# Patient Record
Sex: Male | Born: 1965 | Race: White | Hispanic: No | Marital: Single | Smoking: Never smoker
Health system: Southern US, Community
[De-identification: ages and names within clinical notes are randomized; demographics above are authoritative.]

## PROBLEM LIST (undated history)

## (undated) DIAGNOSIS — B019 Varicella without complication: Secondary | ICD-10-CM

## (undated) DIAGNOSIS — I10 Essential (primary) hypertension: Secondary | ICD-10-CM

## (undated) DIAGNOSIS — M109 Gout, unspecified: Secondary | ICD-10-CM

## (undated) DIAGNOSIS — E785 Hyperlipidemia, unspecified: Secondary | ICD-10-CM

## (undated) HISTORY — DX: Essential (primary) hypertension: I10

## (undated) HISTORY — DX: Hyperlipidemia, unspecified: E78.5

## (undated) HISTORY — PX: COLONOSCOPY: SHX174

## (undated) HISTORY — DX: Gout, unspecified: M10.9

## (undated) HISTORY — DX: Varicella without complication: B01.9

---

## 2011-01-01 ENCOUNTER — Ambulatory Visit (INDEPENDENT_AMBULATORY_CARE_PROVIDER_SITE_OTHER): Payer: Private Health Insurance - Indemnity | Admitting: Family Medicine

## 2011-01-01 ENCOUNTER — Encounter: Payer: Self-pay | Admitting: Family Medicine

## 2011-01-01 VITALS — BP 130/80 | HR 72 | Temp 98.6°F | Resp 12 | Ht 69.0 in | Wt 203.0 lb

## 2011-01-01 DIAGNOSIS — Z0000 Encounter for general adult medical examination without abnormal findings: Secondary | ICD-10-CM

## 2011-01-01 DIAGNOSIS — Z136 Encounter for screening for cardiovascular disorders: Secondary | ICD-10-CM

## 2011-01-01 LAB — CBC WITH DIFFERENTIAL/PLATELET
Basophils Absolute: 0 10*3/uL (ref 0.0–0.1)
Basophils Relative: 0.8 % (ref 0.0–3.0)
Eosinophils Absolute: 0.2 10*3/uL (ref 0.0–0.7)
Hemoglobin: 15.4 g/dL (ref 13.0–17.0)
Lymphocytes Relative: 44.6 % (ref 12.0–46.0)
MCHC: 35.1 g/dL (ref 30.0–36.0)
Monocytes Relative: 7.6 % (ref 3.0–12.0)
Neutro Abs: 2.1 10*3/uL (ref 1.4–7.7)
Neutrophils Relative %: 43.6 % (ref 43.0–77.0)
RBC: 4.95 Mil/uL (ref 4.22–5.81)

## 2011-01-01 LAB — BASIC METABOLIC PANEL
BUN: 20 mg/dL (ref 6–23)
CO2: 26 mEq/L (ref 19–32)
Chloride: 105 mEq/L (ref 96–112)
GFR: 62.98 mL/min (ref >60.00)
Glucose, Bld: 93 mg/dL (ref 70–99)
Potassium: 4.4 mEq/L (ref 3.5–5.1)
Sodium: 140 mEq/L (ref 135–145)

## 2011-01-01 LAB — LIPID PANEL
HDL: 50.3 mg/dL (ref >39.00)
Total CHOL/HDL Ratio: 4
VLDL: 21.8 mg/dL (ref 0.0–40.0)

## 2011-01-01 LAB — HEPATIC FUNCTION PANEL
ALT: 27 U/L (ref 0–53)
Alkaline Phosphatase: 61 U/L (ref 39–117)
Bilirubin, Direct: 0.1 mg/dL (ref 0.0–0.3)
Total Bilirubin: 0.8 mg/dL (ref 0.3–1.2)

## 2011-01-01 MED ORDER — TETANUS-DIPHTH-ACELL PERTUSSIS 5-2.5-18.5 LF-MCG/0.5 IM SUSP: 0.5000 mL | Freq: Once | INTRAMUSCULAR | Status: DC

## 2011-01-01 NOTE — Progress Notes (Signed)
  Subjective:    Patient ID: James Monroe, male    DOB: Oct 21, 1965, 45 y.o.   MRN: [id number]  HPI Patient is seen to establish care. He is here for complete physical examination. Past medical history reviewed. No chronic medical problems. No prior surgeries. Takes no medications. No known drug allergies. Patient exercises regularly. Last tetanus unknown but felt to be over 10 years ago. Nonsmoker. No regular alcohol use.  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 Family history is significant for father having heart disease with bypass age 81 along with hyperlipidemia and hypertension.   Review of Systems  Constitutional: Negative for fever, activity change, appetite change and fatigue.  HENT: Negative for ear pain, congestion and trouble swallowing.   Eyes: Negative for pain and visual disturbance.  Respiratory: Negative for cough, shortness of breath and wheezing.   Cardiovascular: Negative for chest pain and palpitations.  Gastrointestinal: Negative for nausea, vomiting, abdominal pain, diarrhea, constipation, blood in stool, abdominal distention and rectal pain.  Genitourinary: Negative for dysuria, hematuria and testicular pain.  Musculoskeletal: Negative for joint swelling and arthralgias.  Skin: Negative for rash.  Neurological: Negative for dizziness, syncope and headaches.  Hematological: Negative for adenopathy.  Psychiatric/Behavioral: Negative for confusion and dysphoric mood.       Objective:   Physical Exam  Constitutional: He is oriented to person, place, and time. He appears well-developed and well-nourished. No distress.  HENT:  Head: Normocephalic and atraumatic.  Right Ear: External ear normal.  Left Ear: External ear normal.  Mouth/Throat: Oropharynx is clear and moist.  Eyes: Conjunctivae and EOM are normal. Pupils are equal, round, and reactive to light.  Neck: Normal range of motion. Neck supple. No thyromegaly present.  Cardiovascular: Normal rate,  regular rhythm and normal heart sounds.   No murmur heard. Pulmonary/Chest: No respiratory distress. He has no wheezes. He has no rales.  Abdominal: Soft. Bowel sounds are normal. He exhibits no distension and no mass. There is no tenderness. There is no rebound and no guarding.  Genitourinary: Rectum normal and prostate normal.  Musculoskeletal: He exhibits no edema.  Lymphadenopathy:    He has no cervical adenopathy.  Neurological: He is alert and oriented to person, place, and time. He displays normal reflexes. No cranial nerve deficit.  Skin: No rash noted.  Psychiatric: He has a normal mood and affect.          Assessment & Plan:  Complete Physical Exam.  Tdap given.  Check screening labs.  Discussed continuation of regular exercise.

## 2011-01-01 NOTE — Patient Instructions (Signed)
We will call you with lab results. Continue regular exercise habits.

## 2011-01-02 NOTE — Progress Notes (Signed)
Quick Note:  Pt informed ______ 

## 2011-01-23 ENCOUNTER — Telehealth: Payer: Self-pay | Admitting: *Deleted

## 2011-01-23 NOTE — Telephone Encounter (Signed)
Would like to speak to Dr. Caryl Never re:  A new diagnosis he received from his dentist of Sleep Apnea.

## 2011-01-24 NOTE — Telephone Encounter (Signed)
Spoke with pt.  He is being evaluated for some sort of dental appliance for sleep apnea.  I explained I'm not familiar with that particular device but will try to do some research.

## 2013-07-27 ENCOUNTER — Ambulatory Visit: Payer: Self-pay | Admitting: Family Medicine

## 2013-07-28 ENCOUNTER — Ambulatory Visit (INDEPENDENT_AMBULATORY_CARE_PROVIDER_SITE_OTHER): Payer: Private Health Insurance - Indemnity | Admitting: Family Medicine

## 2013-07-28 ENCOUNTER — Encounter: Payer: Self-pay | Admitting: Family Medicine

## 2013-07-28 VITALS — BP 130/70 | HR 60 | Temp 97.8°F | Wt 203.0 lb

## 2013-07-28 DIAGNOSIS — J019 Acute sinusitis, unspecified: Secondary | ICD-10-CM

## 2013-07-28 MED ORDER — AMOXICILLIN-POT CLAVULANATE 875-125 MG PO TABS: 1.0000 | ORAL_TABLET | Freq: Two times a day (BID) | ORAL | Status: DC

## 2013-07-28 NOTE — Progress Notes (Signed)
   Subjective:    Patient ID: James Monroe, male    DOB: 09-14-1965, 47 y.o.   MRN: [id number]  HPI Acute visit Patient seen with 3 week history of productive cough. He has no chronic medical problems. Nonsmoker. He's had some bifrontal sinus pressure. Cough productive of yellow sputum. Occasional yellow-tan nasal mucus. Intermittent headaches. Rare sore throat. Denies any nausea or vomiting. No relief with over-the-counter medications. Recently has had some flying and had some ear pain with that.  Past Medical History  Diagnosis Date  . Chicken pox   . Concussion 2001   No past surgical history on file.  reports that he has never smoked. He does not have any smokeless tobacco history on file. His alcohol and drug histories are not on file. family history includes Heart disease (age of onset: 78) in his father; Hyperlipidemia in his father; Hypertension in his father. No Known Allergies    Review of Systems  Constitutional: Negative for fever and chills.  HENT: Positive for congestion.   Respiratory: Positive for cough. Negative for shortness of breath and wheezing.        Objective:   Physical Exam  Constitutional: He appears well-developed and well-nourished.  HENT:  Right Ear: External ear normal.  Left Ear: External ear normal.  Mouth/Throat: Oropharynx is clear and moist.  Neck: Neck supple.  Cardiovascular: Normal rate.   Pulmonary/Chest: Effort normal and breath sounds normal. No respiratory distress. He has no wheezes. He has no rales.  Lymphadenopathy:    He has no cervical adenopathy.          Assessment & Plan:  Probable acute sinusitis. Given duration of symptoms, start Augmentin 875 mg twice daily. Try over-the-counter Mucinex. Hydrate well. Followup as needed.

## 2013-07-28 NOTE — Progress Notes (Signed)
Pre visit review using our clinic review tool, if applicable. No additional management support is needed unless otherwise documented below in the visit note. 

## 2013-07-28 NOTE — Patient Instructions (Signed)

## 2015-03-29 ENCOUNTER — Other Ambulatory Visit (INDEPENDENT_AMBULATORY_CARE_PROVIDER_SITE_OTHER): Payer: No Typology Code available for payment source

## 2015-03-29 DIAGNOSIS — Z0000 Encounter for general adult medical examination without abnormal findings: Secondary | ICD-10-CM

## 2015-03-29 LAB — BASIC METABOLIC PANEL
BUN: 20 mg/dL (ref 6–23)
CALCIUM: 9.5 mg/dL (ref 8.4–10.5)
CO2: 28 meq/L (ref 19–32)
CREATININE: 1.11 mg/dL (ref 0.40–1.50)
Chloride: 106 mEq/L (ref 96–112)
GFR: 74.26 mL/min (ref >60.00)
Glucose, Bld: 105 mg/dL — ABNORMAL HIGH (ref 70–99)
POTASSIUM: 4.4 meq/L (ref 3.5–5.1)
Sodium: 141 mEq/L (ref 135–145)

## 2015-03-29 LAB — CBC WITH DIFFERENTIAL/PLATELET
BASOS ABS: 0 10*3/uL (ref 0.0–0.1)
Basophils Relative: 0.6 % (ref 0.0–3.0)
EOS ABS: 0.1 10*3/uL (ref 0.0–0.7)
Eosinophils Relative: 2.9 % (ref 0.0–5.0)
HEMATOCRIT: 46.1 % (ref 39.0–52.0)
Hemoglobin: 15.8 g/dL (ref 13.0–17.0)
LYMPHS PCT: 40.2 % (ref 12.0–46.0)
Lymphs Abs: 2.1 10*3/uL (ref 0.7–4.0)
MCHC: 34.3 g/dL (ref 30.0–36.0)
MCV: 89.2 fl (ref 78.0–100.0)
MONO ABS: 0.3 10*3/uL (ref 0.1–1.0)
MONOS PCT: 6.1 % (ref 3.0–12.0)
NEUTROS ABS: 2.6 10*3/uL (ref 1.4–7.7)
Neutrophils Relative %: 50.2 % (ref 43.0–77.0)
PLATELETS: 197 10*3/uL (ref 150.0–400.0)
RBC: 5.17 Mil/uL (ref 4.22–5.81)
RDW: 12.8 % (ref 11.5–15.5)
WBC: 5.1 10*3/uL (ref 4.0–10.5)

## 2015-03-29 LAB — POCT URINALYSIS DIPSTICK
Bilirubin, UA: NEGATIVE
GLUCOSE UA: NEGATIVE
KETONES UA: NEGATIVE
Leukocytes, UA: NEGATIVE
Nitrite, UA: NEGATIVE
PH UA: 5.5
Protein, UA: NEGATIVE
UROBILINOGEN UA: 0.2

## 2015-03-29 LAB — PSA: PSA: 0.83 ng/mL (ref 0.10–4.00)

## 2015-03-29 LAB — HEPATIC FUNCTION PANEL
ALBUMIN: 4.5 g/dL (ref 3.5–5.2)
ALK PHOS: 71 U/L (ref 39–117)
ALT: 21 U/L (ref 0–53)
AST: 17 U/L (ref 0–37)
BILIRUBIN TOTAL: 0.5 mg/dL (ref 0.2–1.2)
Bilirubin, Direct: 0.1 mg/dL (ref 0.0–0.3)
Total Protein: 6.8 g/dL (ref 6.0–8.3)

## 2015-03-29 LAB — LIPID PANEL
Cholesterol: 222 mg/dL — ABNORMAL HIGH (ref 0–200)
HDL: 64.4 mg/dL (ref >39.00)
LDL CALC: 135 mg/dL — AB (ref 0–99)
NONHDL: 157.56
TRIGLYCERIDES: 113 mg/dL (ref 0.0–149.0)
Total CHOL/HDL Ratio: 3
VLDL: 22.6 mg/dL (ref 0.0–40.0)

## 2015-03-29 LAB — TSH: TSH: 2.66 u[IU]/mL (ref 0.35–4.50)

## 2015-04-05 ENCOUNTER — Encounter: Payer: Self-pay | Admitting: Family Medicine

## 2015-04-13 ENCOUNTER — Ambulatory Visit (INDEPENDENT_AMBULATORY_CARE_PROVIDER_SITE_OTHER): Payer: No Typology Code available for payment source | Admitting: Family Medicine

## 2015-04-13 ENCOUNTER — Encounter: Payer: Self-pay | Admitting: Family Medicine

## 2015-04-13 VITALS — BP 128/78 | HR 60 | Temp 97.9°F | Ht 69.0 in | Wt 203.2 lb

## 2015-04-13 DIAGNOSIS — R312 Other microscopic hematuria: Secondary | ICD-10-CM | POA: Diagnosis not present

## 2015-04-13 DIAGNOSIS — B351 Tinea unguium: Secondary | ICD-10-CM

## 2015-04-13 DIAGNOSIS — Z0000 Encounter for general adult medical examination without abnormal findings: Secondary | ICD-10-CM | POA: Diagnosis not present

## 2015-04-13 DIAGNOSIS — Z113 Encounter for screening for infections with a predominantly sexual mode of transmission: Secondary | ICD-10-CM

## 2015-04-13 DIAGNOSIS — R3129 Other microscopic hematuria: Secondary | ICD-10-CM

## 2015-04-13 LAB — POCT URINALYSIS DIPSTICK
BILIRUBIN UA: NEGATIVE
Blood, UA: NEGATIVE
Glucose, UA: NEGATIVE
KETONES UA: NEGATIVE
Leukocytes, UA: NEGATIVE
Nitrite, UA: NEGATIVE
PROTEIN UA: NEGATIVE
SPEC GRAV UA: 1.025
Urobilinogen, UA: 0.2
pH, UA: 6

## 2015-04-13 MED ORDER — TERBINAFINE HCL 250 MG PO TABS: 250.0000 mg | ORAL_TABLET | Freq: Every day | ORAL | Status: DC

## 2015-04-13 NOTE — Progress Notes (Signed)
Subjective:    Patient ID: James Monroe, male    DOB: 17-Sep-1965, 49 y.o.   MRN: [id number]  HPI Patient seen for complete physical. Generally very healthy. Takes no regular medications. No chronic medical problems. Last tetanus 2012. He turned 48 this year. He has not had screening colonoscopy yet but is willing to go. He wishes to call back with his preference for provider.  He does have a separate issue of dystrophic fingernail right middle finger. No associated pain. He's had onychomycosis involving toenails in the past and thinks he took Lamisil for that without event. No history of liver problems. Nonsmoker. Exercises regularly. Family history reviewed. His father had coronary disease age 45  Patient also requesting STD screening. He's had multiple sexual partners. Does use barrier protection. No history of STD. No recent concerning rashes, dysuria, or penile discharge.  Past Medical History  Diagnosis Date  . Chicken pox   . Concussion 2001   No past surgical history on file.  reports that he has never smoked. He does not have any smokeless tobacco history on file. His alcohol and drug histories are not on file. family history includes Heart disease (age of onset: 55) in his father; Hypertension in his father. No Known Allergies    Review of Systems  Constitutional: Negative for fever, activity change, appetite change and fatigue.  HENT: Negative for congestion, ear pain and trouble swallowing.   Eyes: Negative for pain and visual disturbance.  Respiratory: Negative for cough, shortness of breath and wheezing.   Cardiovascular: Negative for chest pain and palpitations.  Gastrointestinal: Negative for nausea, vomiting, abdominal pain, diarrhea, constipation, blood in stool, abdominal distention and rectal pain.  Genitourinary: Negative for dysuria, hematuria and testicular pain.  Musculoskeletal: Negative for joint swelling and arthralgias.  Skin: Negative for rash.    Neurological: Negative for dizziness, syncope and headaches.  Hematological: Negative for adenopathy.  Psychiatric/Behavioral: Negative for confusion and dysphoric mood.       Objective:   Physical Exam  Constitutional: He is oriented to person, place, and time. He appears well-developed and well-nourished. No distress.  HENT:  Head: Normocephalic and atraumatic.  Right Ear: External ear normal.  Left Ear: External ear normal.  Mouth/Throat: Oropharynx is clear and moist.  Eyes: Conjunctivae and EOM are normal. Pupils are equal, round, and reactive to light.  Neck: Normal range of motion. Neck supple. No thyromegaly present.  Cardiovascular: Normal rate, regular rhythm and normal heart sounds.   No murmur heard. Pulmonary/Chest: No respiratory distress. He has no wheezes. He has no rales.  Abdominal: Soft. Bowel sounds are normal. He exhibits no distension and no mass. There is no tenderness. There is no rebound and no guarding.  Musculoskeletal: He exhibits no edema.  Lymphadenopathy:    He has no cervical adenopathy.  Neurological: He is alert and oriented to person, place, and time. He displays normal reflexes. No cranial nerve deficit.  Skin: No rash noted.  Right middle finger mild dystrophic changes along one lateral border. He has some brittle changes involving that border No paronychia  Psychiatric: He has a normal mood and affect.          Assessment & Plan:  #1 complete physical. Labs reviewed. He does have mildly elevated glucose 105 which was surprising to him. Continue regular exercise habits. Watch sugar and starch intake. Consider follow-up fasting glucose in one year and consider A1c then.  His urine did reveal trace blood on dipstick and repeat today is  negative #2 mild onychomycosis changes right middle finger. Lamisil 250 mg daily for 3 months. Reviewed potential side effects #3 STD screening-per patient request. Check for HIV, RPR, urine for GC and Chlamydia,  hepatitis B screening (he is not sure if these had previous vaccination series)

## 2015-04-13 NOTE — Patient Instructions (Signed)
Recommend screening colonoscopy this year. Let me know if you need a referral-and your preference for provider Your last tetanus was 2012 and you will not need a booster until 2022 Your blood sugar was mildly elevated at 105 which is prediabetes range. Continue regular exercise habits. Watch sugar and white starch intake

## 2015-04-13 NOTE — Progress Notes (Signed)
Pre visit review using our clinic review tool, if applicable. No additional management support is needed unless otherwise documented below in the visit note. 

## 2015-04-14 LAB — HEPATITIS B SURFACE ANTIGEN: HEP B S AG: NEGATIVE

## 2015-04-14 LAB — HIV ANTIBODY (ROUTINE TESTING W REFLEX): HIV 1&2 Ab, 4th Generation: NONREACTIVE

## 2015-04-14 LAB — RPR

## 2015-04-14 LAB — GC/CHLAMYDIA PROBE AMP, URINE
CHLAMYDIA, SWAB/URINE, PCR: NEGATIVE
GC Probe Amp, Urine: NEGATIVE

## 2015-04-14 LAB — HEPATITIS B SURFACE ANTIBODY,QUALITATIVE: Hep B S Ab: NEGATIVE

## 2015-08-17 ENCOUNTER — Encounter: Payer: Self-pay | Admitting: Family Medicine

## 2015-08-17 ENCOUNTER — Ambulatory Visit (INDEPENDENT_AMBULATORY_CARE_PROVIDER_SITE_OTHER): Payer: Managed Care, Other (non HMO) | Admitting: Family Medicine

## 2015-08-17 VITALS — BP 150/90 | HR 87 | Temp 98.3°F | Resp 16 | Ht 69.0 in | Wt 213.2 lb

## 2015-08-17 DIAGNOSIS — L209 Atopic dermatitis, unspecified: Secondary | ICD-10-CM | POA: Diagnosis not present

## 2015-08-17 MED ORDER — TRIAMCINOLONE ACETONIDE 0.1 % EX CREA: 1.0000 "application " | TOPICAL_CREAM | Freq: Two times a day (BID) | CUTANEOUS | Status: DC | PRN

## 2015-08-17 NOTE — Progress Notes (Signed)
   Subjective:    Patient ID: James Monroe, male    DOB: February 01, 1966, 49 y.o.   MRN: [id number]  HPI Patient seen for acute problem which is bilateral arm rash Onset about 11 days ago. Only thing different is that he was house sitting for a cat. He did not recall any nasal congestion. No other areas of rash. Rashes pruritic. He tried moisturizers without much improvement. No history of atopic dermatitis. Denies any recent change of soaps or detergents.  Past Medical History  Diagnosis Date  . Chicken pox   . Concussion 2001   No past surgical history on file.  reports that he has never smoked. He does not have any smokeless tobacco history on file. His alcohol and drug histories are not on file. family history includes Heart disease (age of onset: 47) in his father; Hypertension in his father. No Known Allergies    Review of Systems  Constitutional: Negative for fever and chills.  HENT: Negative for congestion.   Respiratory: Negative for cough.   Skin: Positive for rash.       Objective:   Physical Exam  Constitutional: He appears well-developed and well-nourished.  Cardiovascular: Normal rate and regular rhythm.   Pulmonary/Chest: Effort normal and breath sounds normal. No respiratory distress. He has no wheezes. He has no rales.  Skin: Rash noted.  Patient has somewhat dry excoriated erythematous rash anterior aspect of elbow bilaterally. Nontender          Assessment & Plan:  Bilateral arm rash. Differential is contact dermatitis versus atopic dermatitis. Suspect the latter. Triamcinolone 0.1% cream twice daily as needed.

## 2015-08-17 NOTE — Progress Notes (Signed)
Pre visit review using our clinic review tool, if applicable. No additional management support is needed unless otherwise documented below in the visit note. 

## 2015-10-20 ENCOUNTER — Encounter: Payer: Self-pay | Admitting: Family Medicine

## 2015-10-20 ENCOUNTER — Ambulatory Visit (INDEPENDENT_AMBULATORY_CARE_PROVIDER_SITE_OTHER): Payer: Managed Care, Other (non HMO) | Admitting: Family Medicine

## 2015-10-20 VITALS — BP 110/80 | HR 68 | Temp 98.2°F | Ht 69.0 in | Wt 214.0 lb

## 2015-10-20 DIAGNOSIS — L259 Unspecified contact dermatitis, unspecified cause: Secondary | ICD-10-CM

## 2015-10-20 MED ORDER — PREDNISONE 10 MG PO TABS: ORAL_TABLET | ORAL | Status: DC

## 2015-10-20 NOTE — Progress Notes (Signed)
   Subjective:    Patient ID: James Monroe, male    DOB: 10/12/1965, 50 y.o.   MRN: [id number]  HPI Pruritic rash of both forearms. Patient had similar reaction back in December of unknown cause. Eventually improved with topical steroid. Current rash itches and is slightly vesicular and very symmetric. Only confined to the dorsal aspect of the forearms He is not aware of any change of soaps or detergents. No plant or chemical exposure. No pets. Rash is exacerbated by heat. No alleviating factors  Past Medical History  Diagnosis Date  . Chicken pox   . Concussion 2001   No past surgical history on file.  reports that he has never smoked. He does not have any smokeless tobacco history on file. His alcohol and drug histories are not on file. family history includes Heart disease (age of onset: 4) in his father; Hypertension in his father. No Known Allergies]\    Review of Systems  Constitutional: Negative for fever and chills.  Skin: Positive for rash.       Objective:   Physical Exam  Constitutional: He appears well-developed and well-nourished.  Cardiovascular: Normal rate and regular rhythm.   Skin: Rash noted.  She has diffuse rash all dorsal aspect of both forearms proximal one half. This is slightly raised and slightly vesicular and nontender.          Assessment & Plan:  Contact dermatitis bilateral forearms. No clear etiology. Prednisone taper over 12 days. Continue to search for possible triggers.

## 2015-10-20 NOTE — Progress Notes (Signed)
Pre visit review using our clinic review tool, if applicable. No additional management support is needed unless otherwise documented below in the visit note. 

## 2015-10-20 NOTE — Patient Instructions (Signed)
Contact Dermatitis Dermatitis is redness, soreness, and swelling (inflammation) of the skin. Contact dermatitis is a reaction to certain substances that touch the skin. There are two types of contact dermatitis:   Irritant contact dermatitis. This type is caused by something that irritates your skin, such as dry hands from washing them too much. This type does not require previous exposure to the substance for a reaction to occur. This type is more common.  Allergic contact dermatitis. This type is caused by a substance that you are allergic to, such as a nickel allergy or poison ivy. This type only occurs if you have been exposed to the substance (allergen) before. Upon a repeat exposure, your body reacts to the substance. This type is less common. CAUSES  Many different substances can cause contact dermatitis. Irritant contact dermatitis is most commonly caused by exposure to:   Makeup.   Soaps.   Detergents.   Bleaches.   Acids.   Metal salts, such as nickel.  Allergic contact dermatitis is most commonly caused by exposure to:   Poisonous plants.   Chemicals.   Jewelry.   Latex.   Medicines.   Preservatives in products, such as clothing.  RISK FACTORS This condition is more likely to develop in:   People who have jobs that expose them to irritants or allergens.  People who have certain medical conditions, such as asthma or eczema.  SYMPTOMS  Symptoms of this condition may occur anywhere on your body where the irritant has touched you or is touched by you. Symptoms include:  Dryness or flaking.   Redness.   Cracks.   Itching.   Pain or a burning feeling.   Blisters.  Drainage of small amounts of blood or clear fluid from skin cracks. With allergic contact dermatitis, there may also be swelling in areas such as the eyelids, mouth, or genitals.  DIAGNOSIS  This condition is diagnosed with a medical history and physical exam. A patch skin test  may be performed to help determine the cause. If the condition is related to your job, you may need to see an occupational medicine specialist. TREATMENT Treatment for this condition includes figuring out what caused the reaction and protecting your skin from further contact. Treatment may also include:   Steroid creams or ointments. Oral steroid medicines may be needed in more severe cases.  Antibiotics or antibacterial ointments, if a skin infection is present.  Antihistamine lotion or an antihistamine taken by mouth to ease itching.  A bandage (dressing). HOME CARE INSTRUCTIONS Skin Care  Moisturize your skin as needed.   Apply cool compresses to the affected areas.  Try taking a bath with:  Epsom salts. Follow the instructions on the packaging. You can get these at your local pharmacy or grocery store.  Baking soda. Pour a small amount into the bath as directed by your health care provider.  Colloidal oatmeal. Follow the instructions on the packaging. You can get this at your local pharmacy or grocery store.  Try applying baking soda paste to your skin. Stir water into baking soda until it reaches a paste-like consistency.  Do not scratch your skin.  Bathe less frequently, such as every other day.  Bathe in lukewarm water. Avoid using hot water. Medicines  Take or apply over-the-counter and prescription medicines only as told by your health care provider.   If you were prescribed an antibiotic medicine, take or apply your antibiotic as told by your health care provider. Do not stop using the   antibiotic even if your condition starts to improve. General Instructions  Keep all follow-up visits as told by your health care provider. This is important.  Avoid the substance that caused your reaction. If you do not know what caused it, keep a journal to try to track what caused it. Write down:  What you eat.  What cosmetic products you use.  What you drink.  What  you wear in the affected area. This includes jewelry.  If you were given a dressing, take care of it as told by your health care provider. This includes when to change and remove it. SEEK MEDICAL CARE IF:   Your condition does not improve with treatment.  Your condition gets worse.  You have signs of infection such as swelling, tenderness, redness, soreness, or warmth in the affected area.  You have a fever.  You have new symptoms. SEEK IMMEDIATE MEDICAL CARE IF:   You have a severe headache, neck pain, or neck stiffness.  You vomit.  You feel very sleepy.  You notice red streaks coming from the affected area.  Your bone or joint underneath the affected area becomes painful after the skin has healed.  The affected area turns darker.  You have difficulty breathing.   This information is not intended to replace advice given to you by your health care provider. Make sure you discuss any questions you have with your health care provider.   Document Released: 08/10/2000 Document Revised: 05/04/2015 Document Reviewed: 12/29/2014 Elsevier Interactive Patient Education 2016 [employer]  

## 2016-09-10 ENCOUNTER — Ambulatory Visit (INDEPENDENT_AMBULATORY_CARE_PROVIDER_SITE_OTHER): Payer: Managed Care, Other (non HMO) | Admitting: Family Medicine

## 2016-09-10 ENCOUNTER — Encounter: Payer: Self-pay | Admitting: Family Medicine

## 2016-09-10 VITALS — BP 130/90 | HR 72 | Temp 98.9°F | Ht 69.0 in | Wt 218.0 lb

## 2016-09-10 DIAGNOSIS — B349 Viral infection, unspecified: Secondary | ICD-10-CM

## 2016-09-10 NOTE — Patient Instructions (Signed)
Follow up for any persistent fever, shortness of breath, or other concerns.

## 2016-09-10 NOTE — Progress Notes (Signed)
Subjective:     Patient ID: James Monroe, male   DOB: 04/13/1966, 52 y.o.   MRN: [id number]  HPI Patient is seen with acute illness which started last Thursday. He developed subjective fever, cough, intermittent headaches, mild body aches, and fatigue. Minimal sore throat. Minimal nasal congestion. He has not taken his temperature. Has had intermittent chills. Has taken Advil intermittently. Cough is relatively mild but sometimes productive. He denies any nausea, vomiting, or diarrhea. No sick contacts.  Past Medical History:  Diagnosis Date  . Chicken pox   . Concussion 2001   No past surgical history on file.  reports that he has never smoked. He does not have any smokeless tobacco history on file. His alcohol and drug histories are not on file. family history includes Heart disease (age of onset: 81) in his father; Hypertension in his father. No Known Allergies   Review of Systems  Constitutional: Positive for chills and fatigue.  HENT: Positive for sore throat.   Respiratory: Positive for cough. Negative for wheezing.   Cardiovascular: Negative for chest pain.  Gastrointestinal: Negative for nausea and vomiting.  Neurological: Positive for headaches.       Objective:   Physical Exam  Constitutional: He appears well-developed and well-nourished.  HENT:  Right Ear: External ear normal.  Left Ear: External ear normal.  Mouth/Throat: Oropharynx is clear and moist.  Neck: Neck supple.  Cardiovascular: Normal rate and regular rhythm.   Pulmonary/Chest: Effort normal and breath sounds normal. No respiratory distress. He has no wheezes. He has no rales.  Lymphadenopathy:    He has no cervical adenopathy.  Skin: No rash noted.       Assessment:     Probable viral syndrome. Nonfocal exam. We considered possible influenza but not recommended testing since he is past 48 hour window and would not recommend Tamiflu at this point    Plan:     -Stay well-hydrated and get extra  rest -Consider getting home thermometer and monitoring temperature -Be in touch if any persistent fever over next 2 days and sooner for any shortness of breath or worsening symptoms  Eulas Post MD [facility]

## 2016-09-10 NOTE — Progress Notes (Signed)
Pre visit review using our clinic review tool, if applicable. No additional management support is needed unless otherwise documented below in the visit note. 

## 2016-11-13 ENCOUNTER — Encounter: Payer: Self-pay | Admitting: Internal Medicine

## 2016-12-27 ENCOUNTER — Ambulatory Visit (AMBULATORY_SURGERY_CENTER): Payer: Self-pay

## 2016-12-27 ENCOUNTER — Encounter: Payer: Self-pay | Admitting: Internal Medicine

## 2016-12-27 VITALS — Ht 69.0 in | Wt 211.6 lb

## 2016-12-27 DIAGNOSIS — Z1211 Encounter for screening for malignant neoplasm of colon: Secondary | ICD-10-CM

## 2016-12-27 MED ORDER — NA SULFATE-K SULFATE-MG SULF 17.5-3.13-1.6 GM/177ML PO SOLN: 1.0000 | Freq: Once | ORAL | 0 refills | Status: AC

## 2016-12-27 NOTE — Progress Notes (Signed)
Denies allergies to eggs or soy products. Denies complication of anesthesia or sedation. Denies use of weight loss medication. Denies use of O2.   Emmi instructions given for colonoscopy.  

## 2017-01-10 ENCOUNTER — Encounter: Payer: Self-pay | Admitting: Internal Medicine

## 2017-01-15 ENCOUNTER — Ambulatory Visit (AMBULATORY_SURGERY_CENTER): Payer: 59 | Admitting: Internal Medicine

## 2017-01-15 ENCOUNTER — Encounter: Payer: Self-pay | Admitting: Internal Medicine

## 2017-01-15 VITALS — BP 117/72 | HR 58 | Temp 98.4°F | Resp 19 | Ht 69.0 in | Wt 218.0 lb

## 2017-01-15 DIAGNOSIS — Z1212 Encounter for screening for malignant neoplasm of rectum: Secondary | ICD-10-CM

## 2017-01-15 DIAGNOSIS — Z1211 Encounter for screening for malignant neoplasm of colon: Secondary | ICD-10-CM | POA: Diagnosis present

## 2017-01-15 DIAGNOSIS — D122 Benign neoplasm of ascending colon: Secondary | ICD-10-CM | POA: Diagnosis not present

## 2017-01-15 DIAGNOSIS — D123 Benign neoplasm of transverse colon: Secondary | ICD-10-CM

## 2017-01-15 MED ORDER — SODIUM CHLORIDE 0.9 % IV SOLN: 500.0000 mL | INTRAVENOUS | Status: DC

## 2017-01-15 NOTE — Op Note (Signed)
[facility] Patient Name: James Monroe Procedure Date: 01/15/2017 3:56 PM MRN: [id number] Endoscopist: Jerene Bears , MD Age: 51 Referring MD:  Date of Birth: 17-Nov-1965 Gender: Male Account #: [id number] Procedure:                Colonoscopy Indications:              Screening for colorectal malignant neoplasm, This                            is the patient's first colonoscopy Medicines:                Monitored Anesthesia Care Procedure:                Pre-Anesthesia Assessment:                           - Prior to the procedure, a History and Physical                            was performed, and patient medications and                            allergies were reviewed. The patient's tolerance of                            previous anesthesia was also reviewed. The risks                            and benefits of the procedure and the sedation                            options and risks were discussed with the patient.                            All questions were answered, and informed consent                            was obtained. Prior Anticoagulants: The patient has                            taken no previous anticoagulant or antiplatelet                            agents. ASA Grade Assessment: II - A patient with                            mild systemic disease. After reviewing the risks                            and benefits, the patient was deemed in                            satisfactory condition to undergo the procedure.  After obtaining informed consent, the colonoscope                            was passed under direct vision. Throughout the                            procedure, the patient's blood pressure, pulse, and                            oxygen saturations were monitored continuously. The                            Colonoscope was introduced through the anus and                            advanced to the the terminal ileum.  The colonoscopy                            was performed without difficulty. The patient                            tolerated the procedure well. The quality of the                            bowel preparation was good. The ileocecal valve,                            appendiceal orifice, and rectum were photographed. Scope In: 4:11:31 PM Scope Out: 4:35:42 PM Scope Withdrawal Time: 0 hours 17 minutes 17 seconds  Total Procedure Duration: 0 hours 24 minutes 11 seconds  Findings:                 The digital rectal exam was normal.                           The terminal ileum appeared normal.                           A 10 mm polyp was found in the ascending colon. The                            polyp was sessile. The polyp was removed with a                            cold snare. Resection and retrieval were complete.                           Five sessile polyps were found in the transverse                            colon. The polyps were 3 to 7 mm in size. These  polyps were removed with a cold snare. Resection                            and retrieval were complete.                           A 8 mm polyp was found in the transverse colon. The                            polyp was semi-pedunculated. The polyp was removed                            with a hot snare. Resection and retrieval were                            complete.                           Multiple small-mouthed diverticula were found in                            the sigmoid colon.                           Internal hemorrhoids were found during                            retroflexion. The hemorrhoids were small. Complications:            No immediate complications. Estimated Blood Loss:     Estimated blood loss was minimal. Impression:               - The examined portion of the ileum was normal.                           - One 10 mm polyp in the ascending colon, removed                             with a cold snare. Resected and retrieved.                           - Five 3 to 7 mm polyps in the transverse colon,                            removed with a cold snare. Resected and retrieved.                           - One 8 mm polyp in the transverse colon, removed                            with a hot snare. Resected and retrieved.                           - Diverticulosis in the sigmoid colon.                           -  Small internal hemorrhoids. Recommendation:           - Patient has a contact number available for                            emergencies. The signs and symptoms of potential                            delayed complications were discussed with the                            patient. Return to normal activities tomorrow.                            Written discharge instructions were provided to the                            patient.                           - Resume previous diet.                           - Continue present medications.                           - Await pathology results.                           - Repeat colonoscopy is recommended for                            surveillance. The colonoscopy date will be                            determined after pathology results from today's                            exam become available for review.                           - Avoid ibuprofen, naproxen, or other non-steroidal                            anti-inflammatory drugs for 2 weeks after polyp                            removal. Jerene Bears, MD 01/15/2017 4:41:26 PM This report has been signed electronically.

## 2017-01-15 NOTE — Progress Notes (Signed)
Called to room to assist during endoscopic procedure.  Patient ID and intended procedure confirmed with present staff. Received instructions for my participation in the procedure from the performing physician.  

## 2017-01-15 NOTE — Progress Notes (Signed)
Spontaneous respirations throughout. VSS. Resting comfortably. To PACU on room air. Report to  Celia RN. 

## 2017-01-15 NOTE — Patient Instructions (Signed)
Discharge instructions given. Handouts on polyps,diverticulosis and hemorrhoids. No ibuprofen, naproxen, or other NSAIDs for two weeks. Resume previous medications. YOU HAD AN ENDOSCOPIC PROCEDURE TODAY AT THE [facility]:   Refer to the procedure report that was given to you for any specific questions about what was found during the examination.  If the procedure report does not answer your questions, please call your gastroenterologist to clarify.  If you requested that your care partner not be given the details of your procedure findings, then the procedure report has been included in a sealed envelope for you to review at your convenience later.  YOU SHOULD EXPECT: Some feelings of bloating in the abdomen. Passage of more gas than usual.  Walking can help get rid of the air that was put into your GI tract during the procedure and reduce the bloating. If you had a lower endoscopy (such as a colonoscopy or flexible sigmoidoscopy) you may notice spotting of blood in your stool or on the toilet paper. If you underwent a bowel prep for your procedure, you may not have a normal bowel movement for a few days.  Please Note:  You might notice some irritation and congestion in your nose or some drainage.  This is from the oxygen used during your procedure.  There is no need for concern and it should clear up in a day or so.  SYMPTOMS TO REPORT IMMEDIATELY:   Following lower endoscopy (colonoscopy or flexible sigmoidoscopy):  Excessive amounts of blood in the stool  Significant tenderness or worsening of abdominal pains  Swelling of the abdomen that is new, acute  Fever of 100F or higher   For urgent or emergent issues, a gastroenterologist can be reached at any hour by calling [phone].   DIET:  We do recommend a small meal at first, but then you may proceed to your regular diet.  Drink plenty of fluids but you should avoid alcoholic beverages for 24 hours.  ACTIVITY:  You  should plan to take it easy for the rest of today and you should NOT DRIVE or use heavy machinery until tomorrow (because of the sedation medicines used during the test).    FOLLOW UP: Our staff will call the number listed on your records the next business day following your procedure to check on you and address any questions or concerns that you may have regarding the information given to you following your procedure. If we do not reach you, we will leave a message.  However, if you are feeling well and you are not experiencing any problems, there is no need to return our call.  We will assume that you have returned to your regular daily activities without incident.  If any biopsies were taken you will be contacted by phone or by letter within the next 1-3 weeks.  Please call us at [phone] if you have not heard about the biopsies in 3 weeks.    SIGNATURES/CONFIDENTIALITY: You and/or your care partner have signed paperwork which will be entered into your electronic medical record.  These signatures attest to the fact that that the information above on your After Visit Summary has been reviewed and is understood.  Full responsibility of the confidentiality of this discharge information lies with you and/or your care-partner. 

## 2017-01-15 NOTE — Progress Notes (Signed)
Pt's states no medical or surgical changes since previsit or office visit.  Pt states he is friends with a Dr Lyndon Code, pathologist and Dr Lyndon Code wants any path to come to him

## 2017-01-16 ENCOUNTER — Telehealth: Payer: Self-pay

## 2017-01-16 NOTE — Telephone Encounter (Signed)
  Follow up Call-  Call back number 01/15/2017  Post procedure Call Back phone  # [phone]  Permission to leave phone message Yes  Some recent data might be hidden     Patient questions:  Do you have a fever, pain , or abdominal swelling? No. Pain Score  0 *  Have you tolerated food without any problems? Yes.    Have you been able to return to your normal activities? Yes.    Do you have any questions about your discharge instructions: Diet   No. Medications  No. Follow up visit  No.  Do you have questions or concerns about your Care? No.  Actions: * If pain score is 4 or above: No action needed, pain <4.  No problems noted per pt.  He reported we have a fantastic staff here at [facility].  maw

## 2017-01-23 ENCOUNTER — Encounter: Payer: Self-pay | Admitting: Internal Medicine

## 2017-04-03 ENCOUNTER — Ambulatory Visit (INDEPENDENT_AMBULATORY_CARE_PROVIDER_SITE_OTHER): Payer: 59 | Admitting: Family Medicine

## 2017-04-03 ENCOUNTER — Encounter: Payer: Self-pay | Admitting: Family Medicine

## 2017-04-03 VITALS — BP 130/86 | HR 68 | Temp 98.3°F | Ht 68.0 in | Wt 186.0 lb

## 2017-04-03 DIAGNOSIS — Z0000 Encounter for general adult medical examination without abnormal findings: Secondary | ICD-10-CM

## 2017-04-03 DIAGNOSIS — Z8249 Family history of ischemic heart disease and other diseases of the circulatory system: Secondary | ICD-10-CM

## 2017-04-03 LAB — CBC WITH DIFFERENTIAL/PLATELET
BASOS PCT: 0.9 % (ref 0.0–3.0)
Basophils Absolute: 0 10*3/uL (ref 0.0–0.1)
EOS PCT: 3.9 % (ref 0.0–5.0)
Eosinophils Absolute: 0.2 10*3/uL (ref 0.0–0.7)
HCT: 46.4 % (ref 39.0–52.0)
HEMOGLOBIN: 15.7 g/dL (ref 13.0–17.0)
LYMPHS ABS: 1.9 10*3/uL (ref 0.7–4.0)
Lymphocytes Relative: 37.9 % (ref 12.0–46.0)
MCHC: 33.9 g/dL (ref 30.0–36.0)
MCV: 90.6 fl (ref 78.0–100.0)
MONO ABS: 0.3 10*3/uL (ref 0.1–1.0)
Monocytes Relative: 6.4 % (ref 3.0–12.0)
NEUTROS ABS: 2.5 10*3/uL (ref 1.4–7.7)
Neutrophils Relative %: 50.9 % (ref 43.0–77.0)
PLATELETS: 210 10*3/uL (ref 150.0–400.0)
RBC: 5.12 Mil/uL (ref 4.22–5.81)
RDW: 13.5 % (ref 11.5–15.5)
WBC: 5 10*3/uL (ref 4.0–10.5)

## 2017-04-03 LAB — BASIC METABOLIC PANEL
BUN: 20 mg/dL (ref 6–23)
CALCIUM: 9.4 mg/dL (ref 8.4–10.5)
CO2: 30 mEq/L (ref 19–32)
Chloride: 105 mEq/L (ref 96–112)
Creatinine, Ser: 1.09 mg/dL (ref 0.40–1.50)
GFR: 75.24 mL/min (ref >60.00)
Glucose, Bld: 98 mg/dL (ref 70–99)
Potassium: 4.3 mEq/L (ref 3.5–5.1)
SODIUM: 140 meq/L (ref 135–145)

## 2017-04-03 LAB — LIPID PANEL
Cholesterol: 236 mg/dL — ABNORMAL HIGH (ref 0–200)
HDL: 53.5 mg/dL (ref >39.00)
LDL Cholesterol: 152 mg/dL — ABNORMAL HIGH (ref 0–99)
NONHDL: 182.55
TRIGLYCERIDES: 154 mg/dL — AB (ref 0.0–149.0)
Total CHOL/HDL Ratio: 4
VLDL: 30.8 mg/dL (ref 0.0–40.0)

## 2017-04-03 LAB — PSA: PSA: 0.78 ng/mL (ref 0.10–4.00)

## 2017-04-03 LAB — TSH: TSH: 3 u[IU]/mL (ref 0.35–4.50)

## 2017-04-03 LAB — HEPATIC FUNCTION PANEL
ALBUMIN: 4.7 g/dL (ref 3.5–5.2)
ALT: 26 U/L (ref 0–53)
AST: 17 U/L (ref 0–37)
Alkaline Phosphatase: 79 U/L (ref 39–117)
Bilirubin, Direct: 0.1 mg/dL (ref 0.0–0.3)
Total Bilirubin: 0.6 mg/dL (ref 0.2–1.2)
Total Protein: 7.1 g/dL (ref 6.0–8.3)

## 2017-04-03 NOTE — Progress Notes (Signed)
Subjective:     Patient ID: James Monroe, male   DOB: March 20, 1966, 51 y.o.   MRN: [id number]  HPI Patient here for physical. He is been diligent with exercise and making some positive dietary changes and has lost over 20 pounds over the past several months. He feels good overall. Good appetite. Recent colonoscopy with recommended 3 year follow-up. No history of hepatitis C screening. He requests PSA screening today. No history of shingles vaccine. Nonsmoker.  Patient's had some mild olecranon bursitis issues bilaterally over the past year. Does rest his elbows frequently on arm chair. No recent injury. No redness or pain.  He's concerned he may have gout. Had couple episodes in past of waking up with acute ankle pain and redness and swelling which eventually improved with Aleve after couple days. His father had gout. Patient has been seeing a dentist with concerns for possible obstructive sleep apnea. He also has history of some teeth grinding. His dentist had proposed nightguard with mandibular advancement to help with airway.  Past Medical History:  Diagnosis Date  . Chicken pox   . Concussion 2001   No past surgical history on file.  reports that he has never smoked. He has never used smokeless tobacco. He reports that he drinks alcohol. He reports that he does not use drugs. family history includes Colon cancer in his maternal grandmother; Heart disease (age of onset: 70) in his father; Hypertension in his father. No Known Allergies  The 10-year ASCVD risk score Mikey Bussing DC Jr., et al., 2013) is: 5%   Values used to calculate the score:     Age: 78 years     Sex: Male     Is Non-Hispanic African American: No     Diabetic: No     Tobacco smoker: No     Systolic Blood Pressure: 696 mmHg     Is BP treated: No     HDL Cholesterol: 53.5 mg/dL     Total Cholesterol: 236 mg/dL  Review of Systems  Constitutional: Negative for activity change, appetite change, fatigue and fever.  HENT: Negative  for congestion, ear pain and trouble swallowing.   Eyes: Negative for pain and visual disturbance.  Respiratory: Negative for cough, shortness of breath and wheezing.   Cardiovascular: Negative for chest pain and palpitations.  Gastrointestinal: Negative for abdominal distention, abdominal pain, blood in stool, constipation, diarrhea, nausea, rectal pain and vomiting.  Genitourinary: Negative for dysuria, hematuria and testicular pain.  Musculoskeletal: Negative for arthralgias and joint swelling.  Skin: Negative for rash.  Neurological: Negative for dizziness, syncope and headaches.  Hematological: Negative for adenopathy.  Psychiatric/Behavioral: Negative for confusion and dysphoric mood.       Objective:   Physical Exam  Constitutional: He is oriented to person, place, and time. He appears well-developed and well-nourished. No distress.  HENT:  Head: Normocephalic and atraumatic.  Right Ear: External ear normal.  Left Ear: External ear normal.  Mouth/Throat: Oropharynx is clear and moist.  Eyes: Pupils are equal, round, and reactive to light. Conjunctivae and EOM are normal.  Neck: Normal range of motion. Neck supple. No thyromegaly present.  Cardiovascular: Normal rate, regular rhythm and normal heart sounds.   No murmur heard. Pulmonary/Chest: No respiratory distress. He has no wheezes. He has no rales.  Abdominal: Soft. Bowel sounds are normal. He exhibits no distension and no mass. There is no tenderness. There is no rebound and no guarding.  Musculoskeletal: He exhibits no edema.  He has mild swelling and  inflammation olecranon bursa left greater than right. No redness. No warmth. Nontender.  Lymphadenopathy:    He has no cervical adenopathy.  Neurological: He is alert and oriented to person, place, and time. He displays normal reflexes. No cranial nerve deficit.  Skin: No rash noted.  Psychiatric: He has a normal mood and affect.       Assessment:     Physical  exam-generally healthy 51 year old male.  Family hx of CAD in father.    Plan:     -Obtain screening lab work. Include hepatitis C antibody -We discussed new shingles vaccine and he will consider -He has family history of premature heart disease in his father and we discussed possible coronary calcium score which he will consider. He will check with insurance coverage first. -Continue exercise and weight control efforts -Regarding olecranon bursitis avoid pressure on bursa. He has no signs of secondary infection.  Eulas Post MD [facility]

## 2017-04-03 NOTE — Patient Instructions (Signed)
Coronary Calcium Scan A coronary calcium scan is an imaging test used to look for deposits of calcium and other fatty materials (plaques) in the inner lining of the blood vessels of the heart (coronary arteries). These deposits of calcium and plaques can partly clog and narrow the coronary arteries without producing any symptoms or warning signs. This puts a person at risk for a heart attack. This test can detect these deposits before symptoms develop. Tell a health care provider about:  Any allergies you have.  All medicines you are taking, including vitamins, herbs, eye drops, creams, and over-the-counter medicines.  Any problems you or family members have had with anesthetic medicines.  Any blood disorders you have.  Any surgeries you have had.  Any medical conditions you have.  Whether you are pregnant or may be pregnant. What are the risks? Generally, this is a safe procedure. However, problems may occur, including:  Harm to a pregnant woman and her unborn baby. This test involves the use of radiation. Radiation exposure can be dangerous to a pregnant woman and her unborn baby. If you are pregnant, you generally should not have this procedure done.  Slight increase in the risk of cancer. This is because of the radiation involved in the test.  What happens before the procedure? No preparation is needed for this procedure. What happens during the procedure?  You will undress and remove any jewelry around your neck or chest.  You will put on a hospital gown.  Sticky electrodes will be placed on your chest. The electrodes will be connected to an electrocardiogram (ECG) machine to record a tracing of the electrical activity of your heart.  A CT scanner will take pictures of your heart. During this time, you will be asked to lie still and hold your breath for 2-3 seconds while a picture of your heart is being taken. The procedure may vary among health care providers and  hospitals. What happens after the procedure?  You can get dressed.  You can return to your normal activities.  It is up to you to get the results of your test. Ask your health care provider, or the department that is doing the test, when your results will be ready. Summary  A coronary calcium scan is an imaging test used to look for deposits of calcium and other fatty materials (plaques) in the inner lining of the blood vessels of the heart (coronary arteries).  Generally, this is a safe procedure. Tell your health care provider if you are pregnant or may be pregnant.  No preparation is needed for this procedure.  A CT scanner will take pictures of your heart.  You can return to your normal activities after the scan is done. This information is not intended to replace advice given to you by your health care provider. Make sure you discuss any questions you have with your health care provider. Document Released: 02/09/2008 Document Revised: 07/02/2016 Document Reviewed: 07/02/2016 Elsevier Interactive Patient Education  2017 Elsevier Inc.  

## 2017-04-04 LAB — HEPATITIS C ANTIBODY: HCV AB: NONREACTIVE

## 2017-04-08 NOTE — Addendum Note (Signed)
Addended by: Eulas Post on: 04/08/2017 09:36 PM   Modules accepted: Orders

## 2017-04-11 ENCOUNTER — Inpatient Hospital Stay: Admission: RE | Admit: 2017-04-11 | Payer: Self-pay | Source: Ambulatory Visit

## 2017-04-17 ENCOUNTER — Ambulatory Visit: Payer: Self-pay

## 2017-04-18 ENCOUNTER — Ambulatory Visit (INDEPENDENT_AMBULATORY_CARE_PROVIDER_SITE_OTHER)
Admission: RE | Admit: 2017-04-18 | Discharge: 2017-04-18 | Disposition: A | Discharge status: Home / Self Care | Payer: Self-pay | Source: Ambulatory Visit | Attending: Family Medicine | Admitting: Family Medicine

## 2017-04-18 DIAGNOSIS — Z8249 Family history of ischemic heart disease and other diseases of the circulatory system: Secondary | ICD-10-CM

## 2017-04-19 ENCOUNTER — Ambulatory Visit (INDEPENDENT_AMBULATORY_CARE_PROVIDER_SITE_OTHER): Payer: 59

## 2017-04-19 DIAGNOSIS — Z23 Encounter for immunization: Secondary | ICD-10-CM

## 2017-04-19 NOTE — Progress Notes (Signed)
After obtaining consent, and per orders of Dr. Elease Hashimoto, injection of Shingrix given by Melburn Hake Cox. Patient instructed to remain in clinic for 20 minutes afterwards, and to report any adverse reaction to me immediately.

## 2017-04-23 ENCOUNTER — Telehealth: Payer: Self-pay | Admitting: Family Medicine

## 2017-04-23 NOTE — Telephone Encounter (Signed)
Patient is aware of results.

## 2017-04-23 NOTE — Telephone Encounter (Signed)
James Monroe pt calling to get results

## 2017-04-24 ENCOUNTER — Other Ambulatory Visit: Payer: Self-pay | Admitting: Family Medicine

## 2017-04-24 DIAGNOSIS — E785 Hyperlipidemia, unspecified: Secondary | ICD-10-CM

## 2017-05-27 ENCOUNTER — Ambulatory Visit (INDEPENDENT_AMBULATORY_CARE_PROVIDER_SITE_OTHER): Payer: 59 | Admitting: *Deleted

## 2017-05-27 DIAGNOSIS — Z23 Encounter for immunization: Secondary | ICD-10-CM

## 2017-06-26 ENCOUNTER — Ambulatory Visit (INDEPENDENT_AMBULATORY_CARE_PROVIDER_SITE_OTHER): Payer: 59 | Admitting: *Deleted

## 2017-06-26 DIAGNOSIS — Z23 Encounter for immunization: Secondary | ICD-10-CM

## 2017-10-02 IMAGING — CT CT HEART SCORING
2 series · 16 of 20 positions shown, 18 images · non-contrast
Comparison: None.

CLINICAL DATA: Risk stratification

EXAM:
Coronary Calcium Score
TECHNIQUE: The patient was scanned on a Siemens Force scanner. Axial
non-contrast 3 mm slices were carried out through the heart. The
data set was analyzed on a dedicated work station and scored using
the Agatson method.

[Series 3: casc 3.0 i36f 2 bestdiast 69 % · axial · 0.36mm/px · z∈[-243,-132]mm · 8 of 49 slices shown, 10 images]
[im 6/49  vessel]
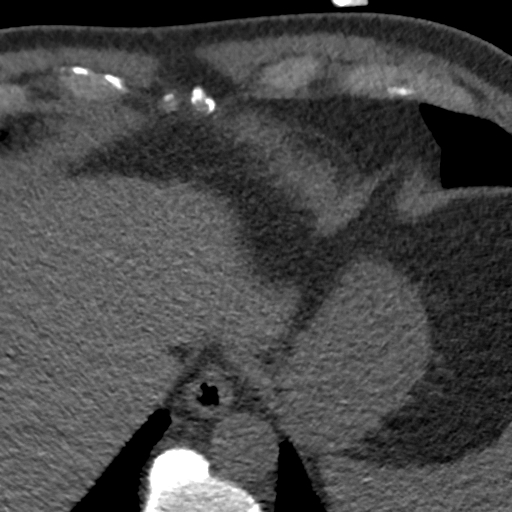
[im 6/49  lung]
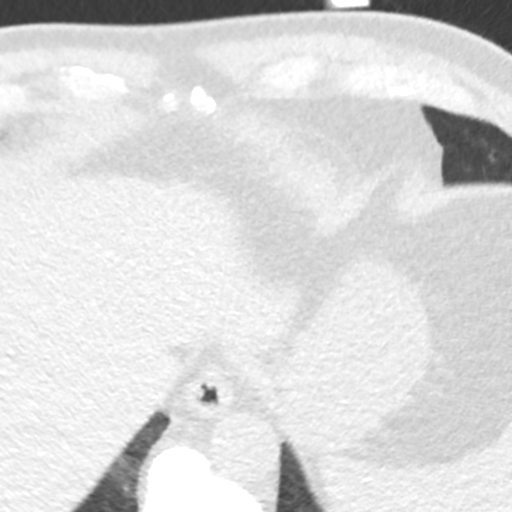
[im 11/49  vessel]
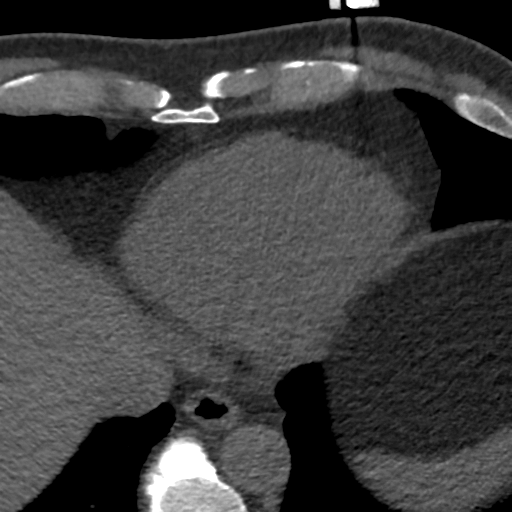
[im 17/49  vessel]
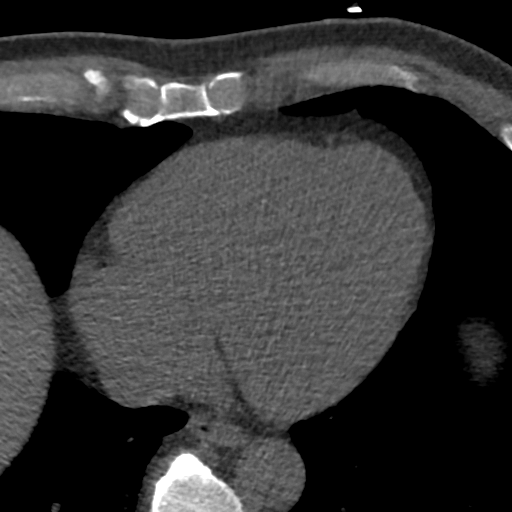
[im 22/49  vessel]
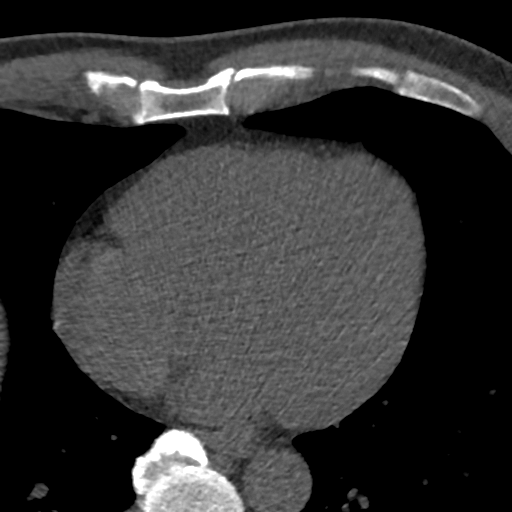
[im 27/49  vessel]
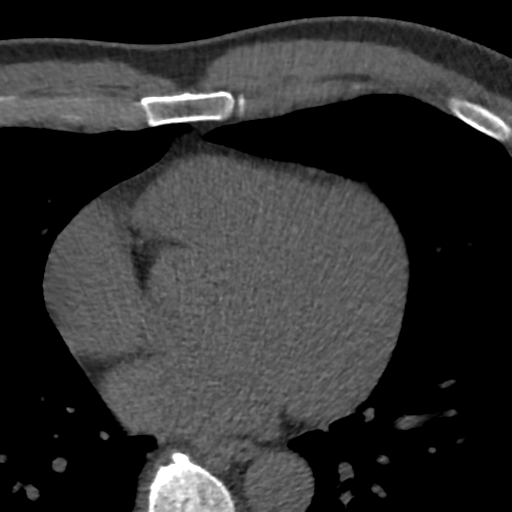
[im 27/49  lung]
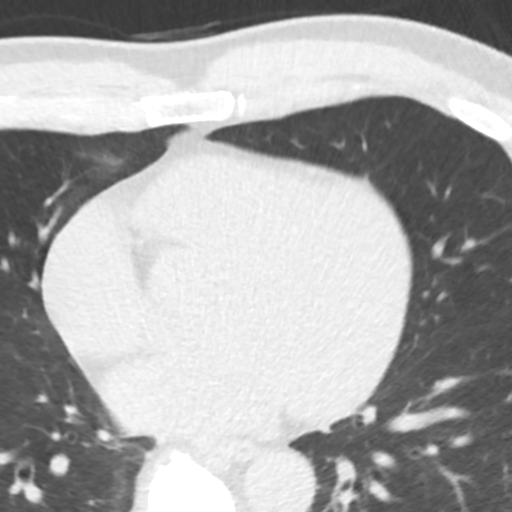
[im 33/49  vessel]
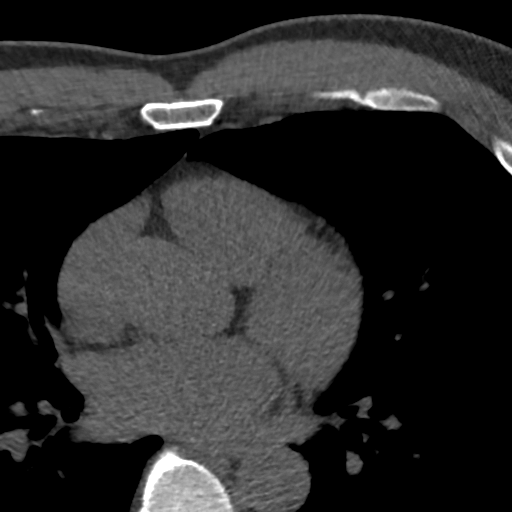
[im 38/49  vessel]
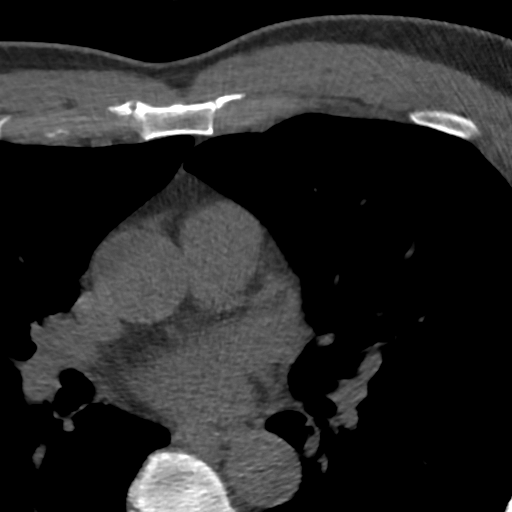
[im 43/49  vessel]
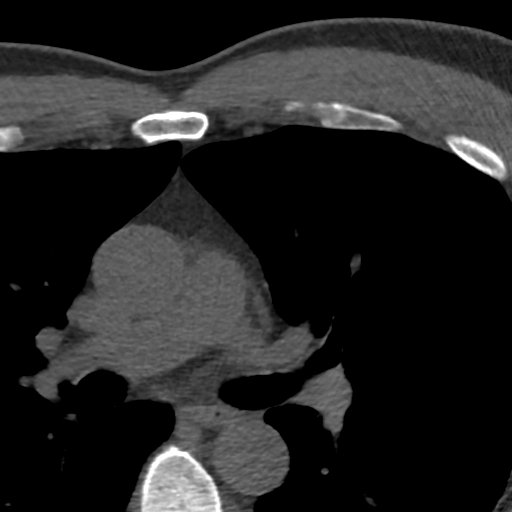

[Series 5: lung st 69 % · axial · 0.71mm/px · z∈[-243,-132]mm · 8 of 49 slices shown]
[im 6/49  lung]
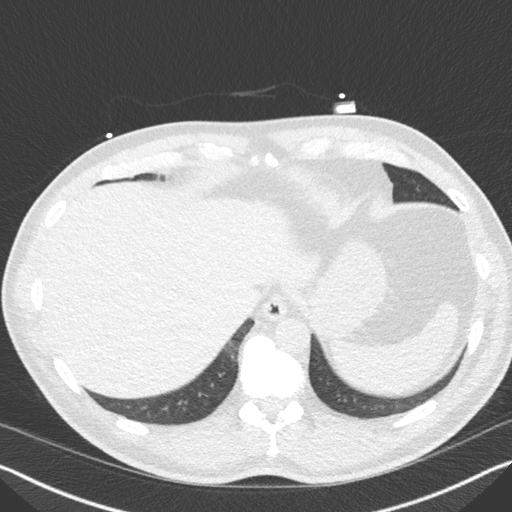
[im 11/49  lung]
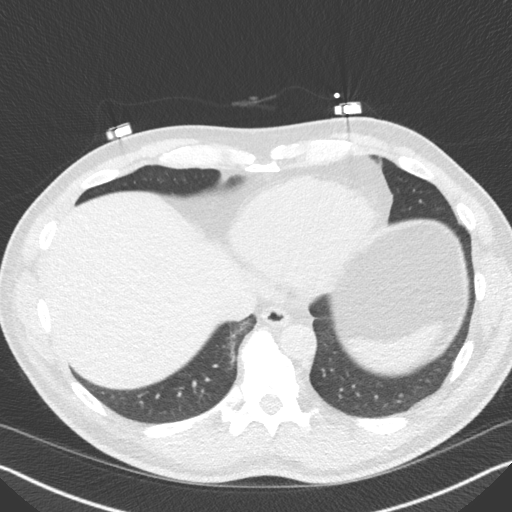
[im 17/49  lung]
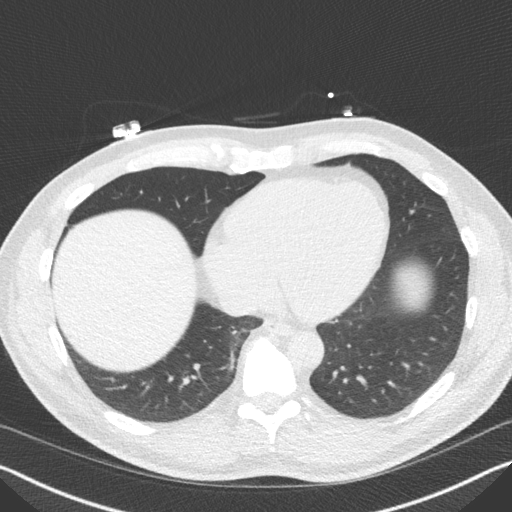
[im 22/49  lung]
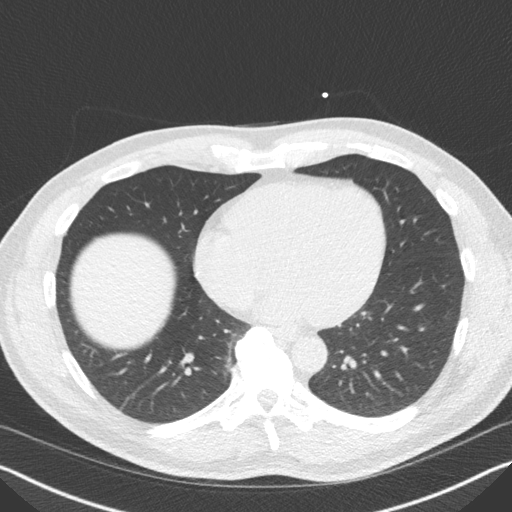
[im 27/49  lung]
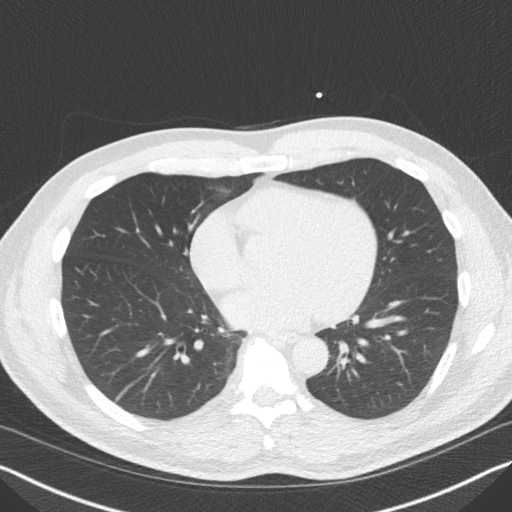
[im 33/49  lung]
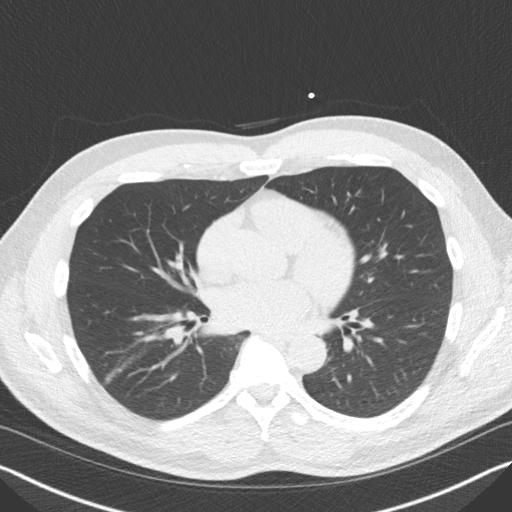
[im 38/49  lung]
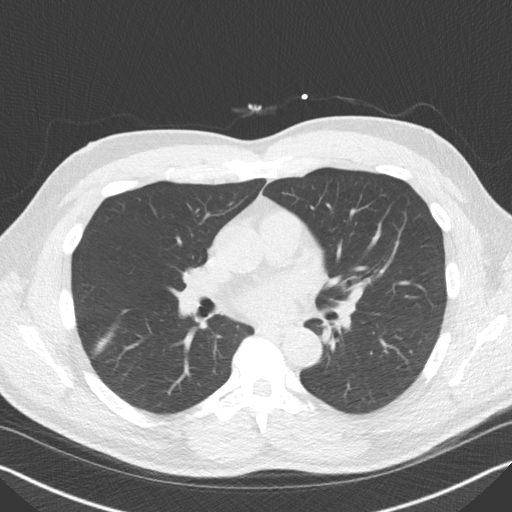
[im 43/49  lung]
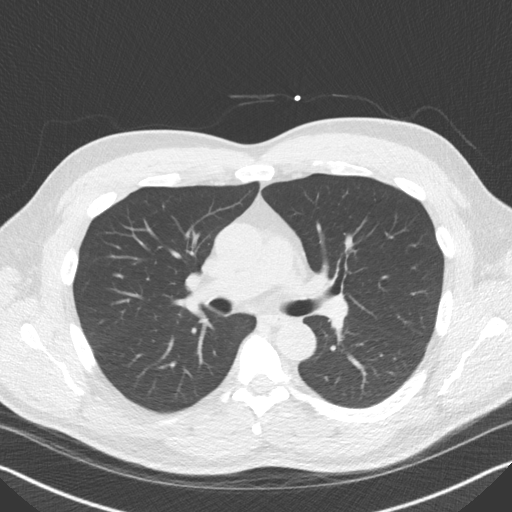

[16 of 20 positions shown; findings below may reference images not displayed]

FINDINGS: Non-cardiac: See separate report from [REDACTED].

Ascending Aorta:  Normal size, trivial calcifications.

Pericardium: Normal.

Coronary arteries:  Normal origin.
IMPRESSION: Coronary calcium score of 0. This was 0 percentile for age and sex
matched control.

Bishnu Stowe

EXAM:
OVER-READ INTERPRETATION  CT CHEST

The following report is an over-read performed by radiologist Dr.
does not include interpretation of cardiac or coronary anatomy or
pathology. The coronary calcium score interpretation by the
cardiologist is attached.
FINDINGS: vascular: Normal aortic caliber.

Mediastinum/Nodes: No imaged thoracic adenopathy.

Lungs/Pleura: No pleural fluid. Right base subsegmental atelectasis.

Upper Abdomen: Normal imaged portions of the liver, spleen, stomach.

Musculoskeletal: No acute osseous abnormality. Moderate thoracic
spondylosis.
IMPRESSION: No acute extracardiac findings in the imaged chest.

## 2018-04-04 ENCOUNTER — Ambulatory Visit: Payer: Managed Care, Other (non HMO) | Admitting: Family Medicine

## 2018-04-04 ENCOUNTER — Encounter: Payer: Self-pay | Admitting: Family Medicine

## 2018-04-04 VITALS — BP 128/80 | HR 63 | Temp 98.6°F | Ht 68.0 in | Wt 214.7 lb

## 2018-04-04 DIAGNOSIS — M1A9XX1 Chronic gout, unspecified, with tophus (tophi): Secondary | ICD-10-CM

## 2018-04-04 LAB — URIC ACID: URIC ACID, SERUM: 8.6 mg/dL — AB (ref 4.0–7.8)

## 2018-04-04 MED ORDER — COLCHICINE 0.6 MG PO TABS: 0.6000 mg | ORAL_TABLET | Freq: Every day | ORAL | 1 refills | Status: DC

## 2018-04-04 MED ORDER — ALLOPURINOL 100 MG PO TABS: ORAL_TABLET | ORAL | 3 refills | Status: DC

## 2018-04-04 NOTE — Progress Notes (Signed)
  Subjective:     Patient ID: James Monroe, male   DOB: 07/16/1966, 52 y.o.   MRN: [id number]  HPI Patient seen with some prominence of the right elbow especially over the past year or so. No injury.  . He's had some gout flareups over the years mostly involving his ankles and feet. He has at least a few flareups per year. His father has gout. He's never seen any eruptive tophi through the skin. No recent erythema. No elbow pain.  Past Medical History:  Diagnosis Date  . Chicken pox   . Concussion 2001   History reviewed. No pertinent surgical history.  reports that he has never smoked. He has never used smokeless tobacco. He reports that he drinks alcohol. He reports that he does not use drugs. family history includes Colon cancer in his maternal grandmother; Heart disease (age of onset: 40) in his father; Hypertension in his father. No Known Allergies   Review of Systems  Constitutional: Negative for chills and fever.       Objective:   Physical Exam  Constitutional: He appears well-developed and well-nourished.  Cardiovascular: Normal rate and regular rhythm.  Musculoskeletal:  Patient has very prominent right olecranon bursa. No erythema. No warmth. No fluctuance.       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 -Check baseline uric acid -Low purine diet -Start allopurinol 100 mg daily for 2 weeks and then increase to 200 mg daily for 2 weeks and then increase to 300 mg daily -Colchicine 0.6 mg 1 daily during titration -Follow-up in 2 months and we'll plan to repeat uric acid level then  Eulas Post MD [facility] [email or web address]

## 2018-04-04 NOTE — Patient Instructions (Signed)

## 2018-05-16 ENCOUNTER — Telehealth: Payer: Self-pay | Admitting: Family Medicine

## 2018-05-16 NOTE — Telephone Encounter (Signed)
Copied from [place] [id number]. Topic: Quick Communication - See Telephone Encounter >> May 16, 2018  3:07 PM Blase Mess A wrote: CRM for notification. See Telephone encounter for: 05/16/18. Patient has been on the allopurinol (ZYLOPRIM) 100 MG tablet [[id number]]  for 7 months.  And for 2 weeks has been on a higher dosage.  Patient is having hives on R arm. Itches a bit.  He wanted some advise. Patients call back [phone]

## 2018-05-19 NOTE — Telephone Encounter (Signed)
Offer office follow up.  If hives only on one arm- doubt med related.

## 2018-05-19 NOTE — Telephone Encounter (Signed)
Spoke with patient and he states "the rash has cleared up, but he will keep an eye on it".

## 2018-05-19 NOTE — Telephone Encounter (Signed)
Would you like an office visit? 

## 2018-06-04 ENCOUNTER — Telehealth: Payer: Self-pay

## 2018-06-04 NOTE — Telephone Encounter (Signed)
Called patient and have scheduled him for follow up visit on 06/06/18 at 7am. Patient verbalized an understanding.  Copied from [place] [id number]. Topic: Appointment Scheduling - Scheduling Inquiry for Clinic >> Jun 04, 2018 11:28 AM Reyne Dumas L wrote: Reason for CRM:   Pt believes he is supposed to come in for a uric acid test. Pt can be reached at [phone].

## 2018-06-06 ENCOUNTER — Encounter: Payer: Self-pay | Admitting: Family Medicine

## 2018-06-06 ENCOUNTER — Other Ambulatory Visit: Payer: Self-pay

## 2018-06-06 ENCOUNTER — Ambulatory Visit: Payer: Managed Care, Other (non HMO) | Admitting: Family Medicine

## 2018-06-06 VITALS — BP 136/90 | HR 59 | Temp 98.1°F | Ht 68.0 in | Wt 218.8 lb

## 2018-06-06 DIAGNOSIS — L259 Unspecified contact dermatitis, unspecified cause: Secondary | ICD-10-CM | POA: Diagnosis not present

## 2018-06-06 DIAGNOSIS — M1A9XX1 Chronic gout, unspecified, with tophus (tophi): Secondary | ICD-10-CM | POA: Diagnosis not present

## 2018-06-06 DIAGNOSIS — Z23 Encounter for immunization: Secondary | ICD-10-CM

## 2018-06-06 DIAGNOSIS — M109 Gout, unspecified: Secondary | ICD-10-CM | POA: Insufficient documentation

## 2018-06-06 LAB — URIC ACID: Uric Acid, Serum: 5.5 mg/dL (ref 4.0–7.8)

## 2018-06-06 NOTE — Progress Notes (Signed)
  Subjective:     Patient ID: James Monroe, male   DOB: 10/27/1965, 52 y.o.   MRN: [id number]  HPI  Patient seen for follow-up regarding gout.  He has large tophus right elbow.  He had one brief flareup involving his ankle since last visit but none since then.  He remains on colchicine once daily and allopurinol 300 mg daily.  Last uric acid 8.6.  Our goal is less than 6.  Patient does relate his father had gout.  He has pruritic skin rash left forearm predominantly with lesser involvement right forearm.  Present for several days.  No known contact allergies.  Rash is pruritic.  Past Medical History:  Diagnosis Date  . Chicken pox   . Concussion 2001   History reviewed. No pertinent surgical history.  reports that he has never smoked. He has never used smokeless tobacco. He reports that he drinks alcohol. He reports that he does not use drugs. family history includes Colon cancer in his maternal grandmother; Heart disease (age of onset: 79) in his father; Hypertension in his father. No Known Allergies  Review of Systems  Constitutional: Negative for chills and fever.  Musculoskeletal: Negative for arthralgias.       Objective:   Physical Exam  Constitutional: He appears well-developed and well-nourished.  Cardiovascular: Normal rate and regular rhythm.  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

## 2018-06-06 NOTE — Addendum Note (Signed)
Addended by: Anibal Henderson on: 06/06/2018 07:29 AM   Modules accepted: Orders

## 2018-06-06 NOTE — Patient Instructions (Signed)
Consider setting up complete physical.     

## 2018-06-09 ENCOUNTER — Other Ambulatory Visit: Payer: Self-pay

## 2018-06-09 MED ORDER — ALLOPURINOL 300 MG PO TABS: 300.0000 mg | ORAL_TABLET | Freq: Every day | ORAL | 3 refills | Status: DC

## 2019-03-23 ENCOUNTER — Telehealth: Payer: Self-pay

## 2019-03-23 NOTE — Telephone Encounter (Signed)
Copied from [place] [phone]. Topic: General - Other >> Mar 23, 2019  2:14 PM Alanda Slim E wrote: Reason for CRM: Pt would like a a call back from [facility] to discuss some treatment he is receiving/ please advise

## 2019-03-23 NOTE — Telephone Encounter (Signed)
Called patient and he stated that he is being treated for gout with Allopurinol and he has had 2 bad spells of gout and he is asking if uric acid can be checked or what does he need to do? He stated that he did not think the colchicine was working very well and he is having deposits in both elbows now and his knee possibly.  I have scheduled patient for 3:45pm Doxy tomorrow.  Sending as an Pharmacist, hospital.

## 2019-03-24 ENCOUNTER — Other Ambulatory Visit: Payer: Self-pay

## 2019-03-24 ENCOUNTER — Ambulatory Visit (INDEPENDENT_AMBULATORY_CARE_PROVIDER_SITE_OTHER): Payer: Managed Care, Other (non HMO) | Admitting: Family Medicine

## 2019-03-24 DIAGNOSIS — M1A9XX1 Chronic gout, unspecified, with tophus (tophi): Secondary | ICD-10-CM

## 2019-03-24 MED ORDER — COLCHICINE 0.6 MG PO TABS: ORAL_TABLET | ORAL | 1 refills | Status: DC

## 2019-03-24 NOTE — Progress Notes (Signed)
Patient ID: James Monroe, male   DOB: 11-May-1966, 53 y.o.   MRN: [id number]  This visit type was conducted due to national recommendations for restrictions regarding the COVID-19 pandemic in an effort to limit this patient's exposure and mitigate transmission in our community.   Virtual Visit via Video Note  I connected with James Monroe on 03/24/19 at  3:45 PM EDT by a video enabled telemedicine application and verified that I am speaking with the correct person using two identifiers.  Location patient: home Location provider:work or home office Persons participating in the virtual visit: patient, provider  I discussed the limitations of evaluation and management by telemedicine and the availability of in person appointments. The patient expressed understanding and agreed to proceed.   HPI:  Patient has history of gout.  He was having frequent flareups and last year went on allopurinol and has been faithfully taking this at 300 mg daily.  He states he has had during the past month couple of acute flareups involving the ankle and knee.  These were typical of previous gout flares with acute pain, swelling, redness, warmth.  He had run out of colchicine.  He does have tophaceous changes on both elbows.  Last uric acid level was 5.5 and this came down from 8.6 after starting allopurinol.  He is aware of potential dietary triggers and generally tries to avoid.  ROS: See pertinent positives and negatives per HPI.  Past Medical History:  Diagnosis Date  . Chicken pox   . Concussion 2001    No past surgical history on file.  Family History  Problem Relation Age of Onset  . Heart disease Father 62       bypass surgery 1995  . Hypertension Father   . Colon cancer Maternal Grandmother   . Esophageal cancer Neg Hx   . Rectal cancer Neg Hx   . Stomach cancer Neg Hx     SOCIAL HX: Non-smoker   Current Outpatient Medications:  .  allopurinol (ZYLOPRIM) 300 MG tablet, Take 1 tablet (300 mg  total) by mouth daily., Disp: 90 tablet, Rfl: 3 .  colchicine 0.6 MG tablet, Take two at onset and then one po bid prn acute gout flares., Disp: 60 tablet, Rfl: 1  Current Facility-Administered Medications:  .  0.9 %  sodium chloride infusion, 500 mL, Intravenous, Continuous, Pyrtle, Lajuan Lines, MD  EXAM:  VITALS per patient if applicable:  GENERAL: alert, oriented, appears well and in no acute distress  HEENT: atraumatic, conjunttiva clear, no obvious abnormalities on inspection of external nose and ears  NECK: normal movements of the head and neck  LUNGS: on inspection no signs of respiratory distress, breathing rate appears normal, no obvious gross SOB, gasping or wheezing  CV: no obvious cyanosis  MS: moves all visible extremities without noticeable abnormality  PSYCH/NEURO: pleasant and cooperative, no obvious depression or anxiety, speech and thought processing grossly intact  ASSESSMENT AND PLAN:  Discussed the following assessment and plan:  Chronic gout with tophus, unspecified cause, unspecified site - Plan: Uric Acid, Basic metabolic panel, CBC with Differential/Platelet -Continue low purine diet and good hydration -Goal uric acid less than 6.  Consider titration of allopurinol if above 6 -Future labs tomorrow as above -Refill colchicine to take as needed for acute gout flares    I discussed the assessment and treatment plan with the patient. The patient was provided an opportunity to ask questions and all were answered. The patient agreed with the plan and demonstrated an  understanding of the instructions.   The patient was advised to call back or seek an in-person evaluation if the symptoms worsen or if the condition fails to improve as anticipated.   Carolann Littler, MD

## 2019-03-25 ENCOUNTER — Other Ambulatory Visit (INDEPENDENT_AMBULATORY_CARE_PROVIDER_SITE_OTHER): Payer: Managed Care, Other (non HMO)

## 2019-03-25 ENCOUNTER — Other Ambulatory Visit: Payer: Self-pay

## 2019-03-25 DIAGNOSIS — M1A9XX1 Chronic gout, unspecified, with tophus (tophi): Secondary | ICD-10-CM | POA: Diagnosis not present

## 2019-03-25 LAB — BASIC METABOLIC PANEL
BUN: 20 mg/dL (ref 6–23)
CO2: 29 mEq/L (ref 19–32)
Calcium: 9.5 mg/dL (ref 8.4–10.5)
Chloride: 107 mEq/L (ref 96–112)
Creatinine, Ser: 0.99 mg/dL (ref 0.40–1.50)
GFR: 78.51 mL/min (ref >60.00)
Glucose, Bld: 84 mg/dL (ref 70–99)
Potassium: 4 mEq/L (ref 3.5–5.1)
Sodium: 142 mEq/L (ref 135–145)

## 2019-03-25 LAB — URIC ACID: Uric Acid, Serum: 5.6 mg/dL (ref 4.0–7.8)

## 2019-03-25 LAB — CBC WITH DIFFERENTIAL/PLATELET
Basophils Absolute: 0 10*3/uL (ref 0.0–0.1)
Basophils Relative: 0.8 % (ref 0.0–3.0)
Eosinophils Absolute: 0.2 10*3/uL (ref 0.0–0.7)
Eosinophils Relative: 3.5 % (ref 0.0–5.0)
HCT: 43.7 % (ref 39.0–52.0)
Hemoglobin: 14.9 g/dL (ref 13.0–17.0)
Lymphocytes Relative: 40.2 % (ref 12.0–46.0)
Lymphs Abs: 2.2 10*3/uL (ref 0.7–4.0)
MCHC: 34.1 g/dL (ref 30.0–36.0)
MCV: 88.9 fl (ref 78.0–100.0)
Monocytes Absolute: 0.4 10*3/uL (ref 0.1–1.0)
Monocytes Relative: 7.2 % (ref 3.0–12.0)
Neutro Abs: 2.6 10*3/uL (ref 1.4–7.7)
Neutrophils Relative %: 48.3 % (ref 43.0–77.0)
Platelets: 195 10*3/uL (ref 150.0–400.0)
RBC: 4.91 Mil/uL (ref 4.22–5.81)
RDW: 13.5 % (ref 11.5–15.5)
WBC: 5.4 10*3/uL (ref 4.0–10.5)

## 2019-04-24 ENCOUNTER — Other Ambulatory Visit: Payer: Self-pay | Admitting: Family Medicine

## 2019-05-22 ENCOUNTER — Other Ambulatory Visit: Payer: Self-pay

## 2019-05-22 ENCOUNTER — Encounter: Payer: Self-pay | Admitting: Family Medicine

## 2019-05-22 ENCOUNTER — Ambulatory Visit (INDEPENDENT_AMBULATORY_CARE_PROVIDER_SITE_OTHER): Payer: Managed Care, Other (non HMO) | Admitting: Family Medicine

## 2019-05-22 VITALS — BP 142/94 | HR 66 | Temp 97.6°F | Ht 68.0 in | Wt 213.4 lb

## 2019-05-22 DIAGNOSIS — Z23 Encounter for immunization: Secondary | ICD-10-CM | POA: Diagnosis not present

## 2019-05-22 DIAGNOSIS — M1A09X0 Idiopathic chronic gout, multiple sites, without tophus (tophi): Secondary | ICD-10-CM

## 2019-05-22 DIAGNOSIS — Z0000 Encounter for general adult medical examination without abnormal findings: Secondary | ICD-10-CM

## 2019-05-22 LAB — HEPATIC FUNCTION PANEL
ALT: 55 U/L — ABNORMAL HIGH (ref 0–53)
AST: 34 U/L (ref 0–37)
Albumin: 4.8 g/dL (ref 3.5–5.2)
Alkaline Phosphatase: 81 U/L (ref 39–117)
Bilirubin, Direct: 0.1 mg/dL (ref 0.0–0.3)
Total Bilirubin: 0.6 mg/dL (ref 0.2–1.2)
Total Protein: 7 g/dL (ref 6.0–8.3)

## 2019-05-22 LAB — BASIC METABOLIC PANEL
BUN: 19 mg/dL (ref 6–23)
CO2: 26 mEq/L (ref 19–32)
Calcium: 9.7 mg/dL (ref 8.4–10.5)
Chloride: 103 mEq/L (ref 96–112)
Creatinine, Ser: 1.05 mg/dL (ref 0.40–1.50)
GFR: 73.32 mL/min (ref >60.00)
Glucose, Bld: 99 mg/dL (ref 70–99)
Potassium: 4.1 mEq/L (ref 3.5–5.1)
Sodium: 139 mEq/L (ref 135–145)

## 2019-05-22 LAB — LDL CHOLESTEROL, DIRECT: Direct LDL: 171 mg/dL

## 2019-05-22 LAB — CBC WITH DIFFERENTIAL/PLATELET
Basophils Absolute: 0.1 10*3/uL (ref 0.0–0.1)
Basophils Relative: 1 % (ref 0.0–3.0)
Eosinophils Absolute: 0.3 10*3/uL (ref 0.0–0.7)
Eosinophils Relative: 5.6 % — ABNORMAL HIGH (ref 0.0–5.0)
HCT: 45.3 % (ref 39.0–52.0)
Hemoglobin: 15.6 g/dL (ref 13.0–17.0)
Lymphocytes Relative: 42.3 % (ref 12.0–46.0)
Lymphs Abs: 2.6 10*3/uL (ref 0.7–4.0)
MCHC: 34.4 g/dL (ref 30.0–36.0)
MCV: 87.9 fl (ref 78.0–100.0)
Monocytes Absolute: 0.4 10*3/uL (ref 0.1–1.0)
Monocytes Relative: 7.4 % (ref 3.0–12.0)
Neutro Abs: 2.7 10*3/uL (ref 1.4–7.7)
Neutrophils Relative %: 43.7 % (ref 43.0–77.0)
Platelets: 217 10*3/uL (ref 150.0–400.0)
RBC: 5.16 Mil/uL (ref 4.22–5.81)
RDW: 13.4 % (ref 11.5–15.5)
WBC: 6.1 10*3/uL (ref 4.0–10.5)

## 2019-05-22 LAB — LIPID PANEL
Cholesterol: 246 mg/dL — ABNORMAL HIGH (ref 0–200)
HDL: 46.1 mg/dL (ref >39.00)
NonHDL: 199.4
Total CHOL/HDL Ratio: 5
Triglycerides: 239 mg/dL — ABNORMAL HIGH (ref 0.0–149.0)
VLDL: 47.8 mg/dL — ABNORMAL HIGH (ref 0.0–40.0)

## 2019-05-22 LAB — TSH: TSH: 3.62 u[IU]/mL (ref 0.35–4.50)

## 2019-05-22 LAB — PSA: PSA: 0.79 ng/mL (ref 0.10–4.00)

## 2019-05-22 MED ORDER — ALLOPURINOL 100 MG PO TABS: ORAL_TABLET | ORAL | 3 refills | Status: DC

## 2019-05-22 NOTE — Progress Notes (Addendum)
Subjective:     Patient ID: James Monroe, male   DOB: 02/16/1966, 53 y.o.   MRN: [id number]  HPI   Maxwell is seen for physical exam.  His predominant complaint has been gout issues this past year.  He has had a few flareups recently involving, for example, the left knee.  He is currently on allopurinol 300 mg daily does think overall this has helped.  Recent uric acid level was 5.6.  He does have history of some tophaceous changes-and even a couple of possible new tophi in the face of treating with allopurinol with good uric acid level.  He recently started back colchicine once daily and is also taking over-the-counter tumeric supplement.  He has gained some weight this year which he attributes to the pandemic and not exercising as regularly.  Non-smoker.  No regular alcohol use.  Family history reviewed.  Father had CABG at 6.  Patient had coronary calcium score last year of 0.  No recent chest pains.  Wt Readings from Last 3 Encounters:  05/22/19 213 lb 6.4 oz (96.8 kg)  06/06/18 218 lb 12.8 oz (99.2 kg)  04/04/18 214 lb 11.2 oz (97.4 kg)   The 10-year ASCVD risk score Mikey Bussing DC Jr., et al., 2013) is: 9.1%   Values used to calculate the score:     Age: 50 years     Sex: Male     Is Non-Hispanic African American: No     Diabetic: No     Tobacco smoker: No     Systolic Blood Pressure: [id number] mmHg     Is BP treated: No     HDL Cholesterol: 46.1 mg/dL     Total Cholesterol: 246 mg/dL    Past Medical History:  Diagnosis Date  . Chicken pox   . Concussion 2001   History reviewed. No pertinent surgical history.  reports that he has never smoked. He has never used smokeless tobacco. He reports current alcohol use. He reports that he does not use drugs. family history includes Colon cancer in his maternal grandmother; Heart disease (age of onset: 31) in his father; Hypertension in his father. No Known Allergies   Review of Systems  Constitutional: Negative for activity change, appetite  change, fatigue and fever.  HENT: Negative for congestion, ear pain and trouble swallowing.   Eyes: Negative for pain and visual disturbance.  Respiratory: Negative for cough, shortness of breath and wheezing.   Cardiovascular: Negative for chest pain and palpitations.  Gastrointestinal: Negative for abdominal distention, abdominal pain, blood in stool, constipation, diarrhea, nausea, rectal pain and vomiting.  Genitourinary: Negative for dysuria, hematuria and testicular pain.  Musculoskeletal: Negative for joint swelling.  Skin: Negative for rash.  Neurological: Negative for dizziness, syncope and headaches.  Hematological: Negative for adenopathy.  Psychiatric/Behavioral: Negative for confusion and dysphoric mood.       Objective:   Physical Exam Constitutional:      General: He is not in acute distress.    Appearance: He is well-developed.  HENT:     Head: Normocephalic and atraumatic.     Right Ear: External ear normal.     Left Ear: External ear normal.  Eyes:     Conjunctiva/sclera: Conjunctivae normal.     Pupils: Pupils are equal, round, and reactive to light.  Neck:     Musculoskeletal: Normal range of motion and neck supple.     Thyroid: No thyromegaly.  Cardiovascular:     Rate and Rhythm: Normal rate and regular rhythm.  Heart sounds: Normal heart sounds. No murmur.  Pulmonary:     Effort: No respiratory distress.     Breath sounds: No wheezing or rales.  Abdominal:     General: Bowel sounds are normal. There is no distension.     Palpations: Abdomen is soft. There is no mass.     Tenderness: There is no abdominal tenderness. There is no guarding or rebound.  Musculoskeletal:     Right lower leg: No edema.     Left lower leg: No edema.     Comments: He has fairly prominent tophi both elbows.  No erythema.  No warmth.  Lymphadenopathy:     Cervical: No cervical adenopathy.  Skin:    Findings: No rash.  Neurological:     Mental Status: He is alert and  oriented to person, place, and time.     Cranial Nerves: No cranial nerve deficit.     Deep Tendon Reflexes: Reflexes normal.        Assessment:     #1 physical exam.  Health maintenance issues addressed as below He does have elevated blood pressure likely reflecting his recent weight gain.  This has not been consistently up previously.  Repeat reading after rest the left arm seated 142/94  #2 history of tophaceous gout.  Recent uric acid at goal at 5.6    Plan:     -Flu vaccine given -Has already had Shingrix -Colonoscopy up-to-date -Obtain screening labs including PSA -We discussed possible titration of allopurinol up to 400 mg daily -We had previously discussed medication such as pegloticase-which is an every 2-week IV medication for persistent tophaceous gout in the setting of uric acid less than 6.  He is not interested in looking at those options at this time. -Expressed our concerns regarding his elevated blood pressure.  Would like to give 70-month trial of getting back to consistent aerobic exercise and weight loss first.  If not improved at follow-up consider medication such as losartan.  Would avoid thiazides with his history of gout  Eulas Post MD [facility] at [facility]

## 2019-05-25 ENCOUNTER — Ambulatory Visit: Payer: Managed Care, Other (non HMO) | Admitting: Family Medicine

## 2019-05-25 ENCOUNTER — Encounter: Payer: Self-pay | Admitting: Family Medicine

## 2019-05-25 ENCOUNTER — Other Ambulatory Visit: Payer: Self-pay

## 2019-05-25 ENCOUNTER — Ambulatory Visit: Payer: Self-pay

## 2019-05-25 VITALS — BP 140/80 | HR 79 | Temp 98.0°F | Wt 215.1 lb

## 2019-05-25 DIAGNOSIS — M7989 Other specified soft tissue disorders: Secondary | ICD-10-CM

## 2019-05-25 NOTE — Telephone Encounter (Signed)
Pt. Reports he received his flu shot Friday in the office and within 24 hours developed a lemon-sized lump in the armpit of that arm - left arm. No rash or redness. No fever. Warm transfer to [place] in the practice for a visit.  Answer Assessment - Initial Assessment Questions 1. SYMPTOMS: "What is the main symptom?" (e.g., redness, swelling, pain)      Lump under left arm 2. ONSET: "When was the vaccine (shot) given?" "How much later did the swelling begin?" (e.g., hours, days ago)      Friday 3. SEVERITY: "How bad is it?"      Moderate 4. FEVER: "Is there a fever?" If so, ask: "What is it, how was it measured, and when did it start?"      No 5. IMMUNIZATIONS GIVEN: "What shots have you recently received?"     Flu vaccine 6. PAST REACTIONS: "Have you reacted to immunizations before?" If so, ask: "What happened?"     No 7. OTHER SYMPTOMS: "Do you have any other symptoms?"     No  Protocols used: IMMUNIZATION REACTIONS-A-AH

## 2019-05-25 NOTE — Progress Notes (Signed)
  Subjective:     Patient ID: James Monroe, male   DOB: Apr 15, 1966, 53 y.o.   MRN: [id number]  HPI Patient is seen with left axillary swelling.  He was seen last week for physical and had flu vaccine.  He initially thought this may be some kind of reaction to his flu vaccine.  However, he has not had any redness or swelling at the site of his vaccine.  He had a fairly active weekend with exercise.  Denies any injury.  Saturday noticed swelling in the left axillary region.  Has not noted any breast masses.  No fevers or chills.  No overlying warmth or erythema.  Mostly nontender.  Past Medical History:  Diagnosis Date  . Chicken pox   . Concussion 2001   No past surgical history on file.  reports that he has never smoked. He has never used smokeless tobacco. He reports current alcohol use. He reports that he does not use drugs. family history includes Colon cancer in his maternal grandmother; Heart disease (age of onset: 109) in his father; Hypertension in his father. No Known Allergies   Review of Systems  Constitutional: Negative for chills and fever.  Respiratory: Negative for cough and shortness of breath.   Hematological: Negative for adenopathy.       Objective:   Physical Exam Constitutional:      Appearance: Normal appearance.  Neck:     Musculoskeletal: Neck supple.  Pulmonary:     Comments: Left breast and right breast are examined.  There are no breast masses.  No nipple retraction.  No nipple discharge.  He has some prominence left axillary region compared to the right.  Somewhat prominent fatty tissue bilaterally but left greater than right.  He has approximately 6 x 6 cm cystic type swelling but no erythema.  No warmth.  Nontender. Lymphadenopathy:     Cervical: No cervical adenopathy.  Skin:    Comments: No evidence of erythema or any visible swelling left deltoid region where flu vaccine was given  Neurological:     Mental Status: He is alert.        Assessment:     Left axillary swelling.  Doubt related to recent flu vaccine.  No appreciable adenopathy.  He does not have any pain, overlying erythema, warmth, etc. to suggest infection such as abscess.    Plan:     -Recommend observation for now.  Consider ultrasound of left axilla in 1 to 2 weeks if not improving  Eulas Post MD [facility] at [facility]

## 2019-05-25 NOTE — Telephone Encounter (Signed)
Pt scheduled for OV today.  

## 2019-05-25 NOTE — Patient Instructions (Signed)
Let's watch for now and let me know if axillary swelling is not going down in 1-2 weeks  Let me know sooner for any redness, fever, increased pain or other concerns.

## 2019-08-14 ENCOUNTER — Other Ambulatory Visit: Payer: Self-pay

## 2019-08-17 ENCOUNTER — Encounter: Payer: Self-pay | Admitting: Family Medicine

## 2019-08-17 ENCOUNTER — Ambulatory Visit: Payer: Managed Care, Other (non HMO) | Admitting: Family Medicine

## 2019-08-17 ENCOUNTER — Other Ambulatory Visit (HOSPITAL_COMMUNITY)
Admission: RE | Admit: 2019-08-17 | Discharge: 2019-08-17 | Disposition: A | Discharge status: Home / Self Care | Payer: Managed Care, Other (non HMO) | Source: Ambulatory Visit | Attending: Family Medicine | Admitting: Family Medicine

## 2019-08-17 ENCOUNTER — Other Ambulatory Visit (INDEPENDENT_AMBULATORY_CARE_PROVIDER_SITE_OTHER): Payer: Managed Care, Other (non HMO)

## 2019-08-17 ENCOUNTER — Other Ambulatory Visit: Payer: Self-pay

## 2019-08-17 VITALS — BP 150/88 | HR 59 | Temp 97.3°F | Ht 68.0 in | Wt 202.4 lb

## 2019-08-17 DIAGNOSIS — Z113 Encounter for screening for infections with a predominantly sexual mode of transmission: Secondary | ICD-10-CM

## 2019-08-17 DIAGNOSIS — I10 Essential (primary) hypertension: Secondary | ICD-10-CM | POA: Diagnosis not present

## 2019-08-17 MED ORDER — LOSARTAN POTASSIUM 50 MG PO TABS: 50.0000 mg | ORAL_TABLET | Freq: Every day | ORAL | 3 refills | Status: DC

## 2019-08-17 NOTE — Progress Notes (Signed)
  Subjective:     Patient ID: James Monroe, male   DOB: 03-03-1966, 52 y.o.   MRN: [id number]  HPI   James Monroe is seen for the following issues  Seen for physical back in the fall.  Blood pressure borderline elevated.  He has lost some weight since then and is exercising more diligently.  Blood pressure unfortunately is back up today.  He has not had any recent headaches or dizziness.  Never treated for hypertension previously.  Does sometimes drink more than 2 alcoholic beverages per day but not regularly.  Does have family history of hypertension in his father  Requesting STD screening.  Recent new partner.  Not having any symptoms such as dysuria, rash, fever He is unsure regarding whether has had hepatitis B vaccine series.  Past Medical History:  Diagnosis Date  . Chicken pox   . Concussion 2001   History reviewed. No pertinent surgical history.  reports that he has never smoked. He has never used smokeless tobacco. He reports current alcohol use. He reports that he does not use drugs. family history includes Colon cancer in his maternal grandmother; Heart disease (age of onset: 50) in his father; Hypertension in his father. No Known Allergies   Review of Systems  Constitutional: Negative for appetite change, fatigue and unexpected weight change.  Eyes: Negative for visual disturbance.  Respiratory: Negative for cough, chest tightness and shortness of breath.   Cardiovascular: Negative for chest pain, palpitations and leg swelling.  Genitourinary: Negative for dysuria and genital sores.  Neurological: Negative for dizziness, syncope, weakness, light-headedness and headaches.       Objective:   Physical Exam Constitutional:      Appearance: He is well-developed.  Eyes:     Pupils: Pupils are equal, round, and reactive to light.  Neck:     Thyroid: No thyromegaly.  Cardiovascular:     Rate and Rhythm: Normal rate and regular rhythm.  Pulmonary:     Effort: Pulmonary effort is  normal. No respiratory distress.     Breath sounds: Normal breath sounds. No wheezing or rales.  Musculoskeletal:     Cervical back: Neck supple.  Neurological:     Mental Status: He is alert.        Assessment:     #1 hypertension currently untreated.  Repeat left arm seated after rest 150/88.  This is in spite of his recent weight loss and increased exercise  #2 request for STD screening.  Currently asymptomatic    Plan:     -Start losartan 50 mg once daily -Continue regular exercise habits and weight loss efforts -Set up 25-month follow-up to reassess -Check chlamydia, gonorrhea, RPR, HIV, hepatitis B surface antigen and antibody  James Post MD [facility] at [facility]

## 2019-08-17 NOTE — Patient Instructions (Signed)

## 2019-08-18 LAB — HEPATITIS B SURFACE ANTIBODY,QUALITATIVE: Hep B S Ab: NONREACTIVE

## 2019-08-18 LAB — HEPATITIS B SURFACE ANTIGEN: Hepatitis B Surface Ag: NONREACTIVE

## 2019-08-18 LAB — RPR: RPR Ser Ql: NONREACTIVE

## 2019-08-18 LAB — HIV ANTIBODY (ROUTINE TESTING W REFLEX): HIV 1&2 Ab, 4th Generation: NONREACTIVE

## 2019-08-18 LAB — URINE CYTOLOGY ANCILLARY ONLY
Chlamydia: NEGATIVE
Comment: NEGATIVE
Comment: NORMAL
Neisseria Gonorrhea: NEGATIVE

## 2019-08-19 ENCOUNTER — Encounter: Payer: Self-pay | Admitting: *Deleted

## 2019-10-09 ENCOUNTER — Other Ambulatory Visit: Payer: Self-pay

## 2019-10-09 ENCOUNTER — Telehealth: Payer: Self-pay | Admitting: Family Medicine

## 2019-10-09 MED ORDER — COLCHICINE 0.6 MG PO TABS: ORAL_TABLET | ORAL | 1 refills | Status: DC

## 2019-10-09 NOTE — Telephone Encounter (Signed)
Called patient and LMOVM to return call  Called and left a detailed VM to let patient know he has refills on file and we do not fill 90 day prescriptions for the Colchicine but I did send in a refill for this as it looked like he needed a refill.

## 2019-10-09 NOTE — Telephone Encounter (Signed)
Medication Refill: Losartan, Colchicine, Allopurinol (3 month supply instead of 1 month) Pharmacy:[facility] [address], [place] [phone]

## 2019-11-16 ENCOUNTER — Ambulatory Visit: Payer: Managed Care, Other (non HMO) | Admitting: Family Medicine

## 2019-11-20 ENCOUNTER — Other Ambulatory Visit: Payer: Self-pay

## 2019-11-23 ENCOUNTER — Ambulatory Visit (INDEPENDENT_AMBULATORY_CARE_PROVIDER_SITE_OTHER): Payer: Managed Care, Other (non HMO) | Admitting: Family Medicine

## 2019-11-23 ENCOUNTER — Ambulatory Visit: Payer: Managed Care, Other (non HMO) | Admitting: Family Medicine

## 2019-11-23 ENCOUNTER — Encounter: Payer: Self-pay | Admitting: Family Medicine

## 2019-11-23 ENCOUNTER — Other Ambulatory Visit: Payer: Self-pay

## 2019-11-23 VITALS — BP 122/78 | HR 66 | Temp 97.7°F | Wt 200.7 lb

## 2019-11-23 DIAGNOSIS — I10 Essential (primary) hypertension: Secondary | ICD-10-CM

## 2019-11-23 NOTE — Progress Notes (Signed)
  Subjective:     Patient ID: James Monroe, male   DOB: 1966-04-09, 53 y.o.   MRN: [id number]  HPI James Monroe is seen for follow-up hypertension.  Back in December we had started him on losartan after an office blood pressure reading of 150/88.  He is tolerating well with no side effects.  He has a wrist monitor and his home readings have varied considerably but he is not sure regarding accuracy.  He is not any recent headaches or dizziness.  His weight is down another 2 pounds.  He would like to lose about 15-20 more pounds eventually.  He has history of gout and his gout has been greatly improved after taking combination of allopurinol and losartan.  No recent flareups.  Has noted few arthralgias in general since starting combination of these drugs  Past Medical History:  Diagnosis Date  . Chicken pox   . Concussion 2001   No past surgical history on file.  reports that he has never smoked. He has never used smokeless tobacco. He reports current alcohol use. He reports that he does not use drugs. family history includes Colon cancer in his maternal grandmother; Heart disease (age of onset: 67) in his father; Hypertension in his father. No Known Allergies   Review of Systems  Constitutional: Negative for chills, fatigue and fever.  Eyes: Negative for visual disturbance.  Respiratory: Negative for cough, chest tightness and shortness of breath.   Cardiovascular: Negative for chest pain, palpitations and leg swelling.  Musculoskeletal: Negative for arthralgias.  Neurological: Negative for dizziness, syncope, weakness, light-headedness and headaches.       Objective:   Physical Exam Vitals reviewed.  Constitutional:      Appearance: Normal appearance.  Cardiovascular:     Rate and Rhythm: Normal rate and regular rhythm.  Pulmonary:     Effort: Pulmonary effort is normal.     Breath sounds: Normal breath sounds.  Musculoskeletal:     Right lower leg: No edema.     Left lower leg: No  edema.  Neurological:     Mental Status: He is alert.        Assessment:     #1 hypertension greatly improved after addition of losartan.  I obtained reading right arm seated after rest 118/70  #2 gout.  Currently doing well with combination of allopurinol and losartan     Plan:     -Continue current medication regimen -Continue weight loss efforts -Discussed importance of establishing regular consistent aerobic exercise -Reassess at physical in September -He will consider new home monitor  James Post MD [facility] at [facility]

## 2020-03-01 ENCOUNTER — Encounter: Payer: Self-pay | Admitting: Internal Medicine

## 2020-04-28 ENCOUNTER — Encounter: Payer: Managed Care, Other (non HMO) | Admitting: Internal Medicine

## 2020-05-25 ENCOUNTER — Ambulatory Visit: Payer: Managed Care, Other (non HMO) | Admitting: Family Medicine

## 2020-05-26 ENCOUNTER — Other Ambulatory Visit: Payer: Self-pay

## 2020-05-26 ENCOUNTER — Ambulatory Visit (AMBULATORY_SURGERY_CENTER): Payer: Self-pay | Admitting: *Deleted

## 2020-05-26 VITALS — Ht 68.0 in | Wt 213.4 lb

## 2020-05-26 DIAGNOSIS — Z800 Family history of malignant neoplasm of digestive organs: Secondary | ICD-10-CM

## 2020-05-26 DIAGNOSIS — Z86010 Personal history of colonic polyps: Secondary | ICD-10-CM

## 2020-05-26 MED ORDER — SUTAB 1479-225-188 MG PO TABS: 1.0000 | ORAL_TABLET | Freq: Once | ORAL | 0 refills | Status: AC

## 2020-05-26 NOTE — Progress Notes (Signed)
Completed covid vaccines April 2021  Pt is aware that care partner will wait in the car during procedure; if they feel like they will be too hot or cold to wait in the car; they may wait in the 4 th floor lobby. Patient is aware to bring only one care partner. We want them to wear a mask (we do not have any that we can provide them), practice social distancing, and we will check their temperatures when they get here.  I did remind the patient that their care partner needs to stay in the parking lot the entire time and have a cell phone available, we will call them when the pt is ready for discharge. Patient will wear mask into building.   No egg or soy allergy  No home oxygen use   No medications for weight loss taken  Pt denies constipation issues  No trouble with anesthesia, moving neck or fam hx/hx of malignant hyperthermia   Sutab code put into RX and paper copy given to pt to show pharmacy  While going over prep instructions, when I got to the part about needing a care partner for his procedure, pt states, "that's going to be a problem for me.  I am single and would have to ask one of my friends to take the day off.  This is a major inconvenience for me.  Most people have a spouse to bring them but I don't."  I explained this is our policy at [facility] and attempted to explain why we need someone here since he will be sedated.  He asked me to ask Dr. Hilarie Fredrickson for this exception, but I stated this is a policy our MD's have agreed upon and we cannot break our own policy. I offered alternatives such as not having sedation for the procedure or the paid care partner information we have.  I asked him if he would like to speak with my manager about this and he states, "no, this is fatiguing me enough- I don't want to speak with your manager."  I apologized for any inconvenience but stated this is something our MD's require in order to have a procedure done.  Understanding voiced.  PV finished and pt thanked  me for my time.

## 2020-05-27 ENCOUNTER — Encounter: Payer: Self-pay | Admitting: Internal Medicine

## 2020-05-29 ENCOUNTER — Other Ambulatory Visit: Payer: Self-pay | Admitting: Family Medicine

## 2020-05-29 DIAGNOSIS — M1A09X0 Idiopathic chronic gout, multiple sites, without tophus (tophi): Secondary | ICD-10-CM

## 2020-06-16 ENCOUNTER — Encounter: Payer: Self-pay | Admitting: Internal Medicine

## 2020-06-16 ENCOUNTER — Ambulatory Visit (AMBULATORY_SURGERY_CENTER): Payer: Managed Care, Other (non HMO) | Admitting: Internal Medicine

## 2020-06-16 ENCOUNTER — Other Ambulatory Visit: Payer: Self-pay

## 2020-06-16 VITALS — BP 107/67 | HR 53 | Temp 98.4°F | Resp 14 | Ht 68.0 in | Wt 213.4 lb

## 2020-06-16 DIAGNOSIS — Z86010 Personal history of colonic polyps: Secondary | ICD-10-CM | POA: Diagnosis present

## 2020-06-16 DIAGNOSIS — D122 Benign neoplasm of ascending colon: Secondary | ICD-10-CM

## 2020-06-16 DIAGNOSIS — D123 Benign neoplasm of transverse colon: Secondary | ICD-10-CM | POA: Diagnosis not present

## 2020-06-16 HISTORY — PX: COLONOSCOPY: SHX174

## 2020-06-16 MED ORDER — SODIUM CHLORIDE 0.9 % IV SOLN: 500.0000 mL | Freq: Once | INTRAVENOUS | Status: DC

## 2020-06-16 NOTE — Op Note (Signed)
[name] Procedure Date: 06/16/2020 8:27 AM MRN: [id number] Endoscopist: Jerene Bears , MD Age: 54 Referring MD:  Date of Birth: Mar 30, 1966 Gender: Male Account #: [id number] Procedure:                Colonoscopy Indications:              High risk colon cancer surveillance: Personal                            history of adenoma and sessile serrated polyps (one                            10 mm in size), Last colonoscopy: May 2018 Medicines:                Monitored Anesthesia Care Procedure:                Pre-Anesthesia Assessment:                           - Prior to the procedure, a History and Physical                            was performed, and patient medications and                            allergies were reviewed. The patient's tolerance of                            previous anesthesia was also reviewed. The risks                            and benefits of the procedure and the sedation                            options and risks were discussed with the patient.                            All questions were answered, and informed consent                            was obtained. Prior Anticoagulants: The patient has                            taken no previous anticoagulant or antiplatelet                            agents. ASA Grade Assessment: II - A patient with                            mild systemic disease. After reviewing the risks                            and benefits, the patient was deemed in  satisfactory condition to undergo the procedure.                           After obtaining informed consent, the colonoscope                            was passed under direct vision. Throughout the                            procedure, the patient's blood pressure, pulse, and                            oxygen saturations were monitored continuously. The                            Colonoscope was introduced  through the anus and                            advanced to the cecum, identified by appendiceal                            orifice and ileocecal valve. The colonoscopy was                            performed without difficulty. The patient tolerated                            the procedure well. The quality of the bowel                            preparation was excellent. The ileocecal valve,                            appendiceal orifice, and rectum were photographed. Scope In: 9:19:49 AM Scope Out: 9:36:40 AM Scope Withdrawal Time: 0 hours 15 minutes 17 seconds  Total Procedure Duration: 0 hours 16 minutes 51 seconds  Findings:                 The digital rectal exam was normal.                           Three sessile polyps were found in the ascending                            colon. The polyps were 3 to 5 mm in size. These                            polyps were removed with a cold snare. Resection                            and retrieval were complete.                           A 8 mm polyp was found in the transverse  colon. The                            polyp was sessile. The polyp was removed with a                            cold snare. Resection and retrieval were complete.                           Internal hemorrhoids were found during                            retroflexion. The hemorrhoids were small. Complications:            No immediate complications. Estimated Blood Loss:     Estimated blood loss was minimal. Impression:               - Three 3 to 5 mm polyps in the ascending colon,                            removed with a cold snare. Resected and retrieved.                           - One 8 mm polyp in the transverse colon, removed                            with a cold snare. Resected and retrieved.                           - Internal hemorrhoids. Recommendation:           - Patient has a contact number available for                            emergencies. The  signs and symptoms of potential                            delayed complications were discussed with the                            patient. Return to normal activities tomorrow.                            Written discharge instructions were provided to the                            patient.                           - Resume previous diet.                           - Continue present medications.                           - Await pathology results.                           -  Repeat colonoscopy is recommended for                            surveillance. The colonoscopy date will be                            determined after pathology results from today's                            exam become available for review. Jerene Bears, MD 06/16/2020 9:39:52 AM This report has been signed electronically.

## 2020-06-16 NOTE — Progress Notes (Signed)
Pt's states no medical or surgical changes since previsit or office visit.   VS taken by CW 

## 2020-06-16 NOTE — Patient Instructions (Signed)
Handouts given for hemorrhoids and polyps.  YOU HAD AN ENDOSCOPIC PROCEDURE TODAY AT THE [facility]:   Refer to the procedure report that was given to you for any specific questions about what was found during the examination.  If the procedure report does not answer your questions, please call your gastroenterologist to clarify.  If you requested that your care partner not be given the details of your procedure findings, then the procedure report has been included in a sealed envelope for you to review at your convenience later.  YOU SHOULD EXPECT: Some feelings of bloating in the abdomen. Passage of more gas than usual.  Walking can help get rid of the air that was put into your GI tract during the procedure and reduce the bloating. If you had a lower endoscopy (such as a colonoscopy or flexible sigmoidoscopy) you may notice spotting of blood in your stool or on the toilet paper. If you underwent a bowel prep for your procedure, you may not have a normal bowel movement for a few days.  Please Note:  You might notice some irritation and congestion in your nose or some drainage.  This is from the oxygen used during your procedure.  There is no need for concern and it should clear up in a day or so.  SYMPTOMS TO REPORT IMMEDIATELY:   Following lower endoscopy (colonoscopy or flexible sigmoidoscopy):  Excessive amounts of blood in the stool  Significant tenderness or worsening of abdominal pains  Swelling of the abdomen that is new, acute  Fever of 100F or higher  For urgent or emergent issues, a gastroenterologist can be reached at any hour by calling [phone]. Do not use MyChart messaging for urgent concerns.    DIET:  We do recommend a small meal at first, but then you may proceed to your regular diet.  Drink plenty of fluids but you should avoid alcoholic beverages for 24 hours.  ACTIVITY:  You should plan to take it easy for the rest of today and you should NOT DRIVE or  use heavy machinery until tomorrow (because of the sedation medicines used during the test).    FOLLOW UP: Our staff will call the number listed on your records 48-72 hours following your procedure to check on you and address any questions or concerns that you may have regarding the information given to you following your procedure. If we do not reach you, we will leave a message.  We will attempt to reach you two times.  During this call, we will ask if you have developed any symptoms of COVID 19. If you develop any symptoms (ie: fever, flu-like symptoms, shortness of breath, cough etc.) before then, please call [phone].  If you test positive for Covid 19 in the 2 weeks post procedure, please call and report this information to us.    If any biopsies were taken you will be contacted by phone or by letter within the next 1-3 weeks.  Please call us at [phone] if you have not heard about the biopsies in 3 weeks.    SIGNATURES/CONFIDENTIALITY: You and/or your care partner have signed paperwork which will be entered into your electronic medical record.  These signatures attest to the fact that that the information above on your After Visit Summary has been reviewed and is understood.  Full responsibility of the confidentiality of this discharge information lies with you and/or your care-partner. 

## 2020-06-16 NOTE — Progress Notes (Signed)
Called to room to assist during endoscopic procedure.  Patient ID and intended procedure confirmed with present staff. Received instructions for my participation in the procedure from the performing physician.  

## 2020-06-16 NOTE — Progress Notes (Signed)
Report to PACU, RN, vss, BBS= Clear.  

## 2020-06-20 ENCOUNTER — Telehealth: Payer: Self-pay

## 2020-06-20 NOTE — Telephone Encounter (Signed)
  Follow up Call-  Call back number 06/16/2020  Post procedure Call Back phone  # [phone]  Permission to leave phone message Yes  Some recent data might be hidden     Patient questions:  Do you have a fever, pain , or abdominal swelling? No. Pain Score  0 *  Have you tolerated food without any problems? Yes.    Have you been able to return to your normal activities? Yes.    Do you have any questions about your discharge instructions: Diet   No. Medications  No. Follow up visit  No.  Do you have questions or concerns about your Care? No.  Actions: * If pain score is 4 or above: No action needed, pain <4.  Have you developed a fever since your procedure? No 2.   Have you had an respiratory symptoms (SOB or cough) since your procedure? No 3.   Have you tested positive for COVID 19 since your procedure No  4.   Have you had any family members/close contacts diagnosed with the COVID 19 since your procedure?  No  If yes to any of these questions please route to Joylene John, RN and Joella Prince, RN

## 2020-06-21 ENCOUNTER — Encounter: Payer: Self-pay | Admitting: Internal Medicine

## 2020-06-29 ENCOUNTER — Ambulatory Visit: Payer: Managed Care, Other (non HMO) | Admitting: Family Medicine

## 2020-06-29 ENCOUNTER — Encounter: Payer: Self-pay | Admitting: Family Medicine

## 2020-06-29 ENCOUNTER — Other Ambulatory Visit: Payer: Self-pay

## 2020-06-29 VITALS — BP 126/80 | HR 60 | Temp 98.1°F | Ht 68.0 in | Wt 202.9 lb

## 2020-06-29 DIAGNOSIS — D369 Benign neoplasm, unspecified site: Secondary | ICD-10-CM | POA: Diagnosis not present

## 2020-06-29 DIAGNOSIS — Z23 Encounter for immunization: Secondary | ICD-10-CM | POA: Diagnosis not present

## 2020-06-29 DIAGNOSIS — M1A9XX1 Chronic gout, unspecified, with tophus (tophi): Secondary | ICD-10-CM | POA: Diagnosis not present

## 2020-06-29 DIAGNOSIS — I10 Essential (primary) hypertension: Secondary | ICD-10-CM | POA: Diagnosis not present

## 2020-06-29 MED ORDER — LOSARTAN POTASSIUM 50 MG PO TABS
50.0000 mg | ORAL_TABLET | Freq: Every day | ORAL | 11 refills | Status: DC
Start: 2020-06-29 — End: 2020-10-12

## 2020-06-29 NOTE — Patient Instructions (Signed)

## 2020-06-29 NOTE — Progress Notes (Signed)
Established Patient Office Visit  Subjective:  Patient ID: James Monroe, male    DOB: 1965/10/08  Age: 54 y.o. MRN: [id number]  CC:  Chief Complaint  Patient presents with  . Follow-up    6 month followup     HPI Carnie Bruemmer presents for follow-up regarding gout and hypertension.  Takes losartan 50 mg daily for hypertension.  No recent dizziness or headaches.  No chest pains.  Is exercising regularly.  He has gout which is treated with allopurinol 400 mg daily.  This is done a very good job of controlling his gout flareups.  He has had tophaceous gout special involving the elbows and those were slowly receding as he has been on allopurinol therapy.  He has not required any acute treatment with colchicine.  Still needs flu vaccine.  He had colonoscopy last year.  He had tubular adenomas.  He was never called with pathology results.  We will add this to his problem list.  Past Medical History:  Diagnosis Date  . Chicken pox   . Concussion 2001  . Gout   . Hyperlipidemia   . Hypertension     Past Surgical History:  Procedure Laterality Date  . COLONOSCOPY      Family History  Problem Relation Age of Onset  . Heart disease Father 42       bypass surgery 1995  . Hypertension Father   . Colon cancer Maternal Grandmother   . Esophageal cancer Neg Hx   . Rectal cancer Neg Hx   . Stomach cancer Neg Hx     Social History   Socioeconomic History  . Marital status: Single    Spouse name: Not on file  . Number of children: Not on file  . Years of education: Not on file  . Highest education level: Not on file  Occupational History  . Not on file  Tobacco Use  . Smoking status: Never Smoker  . Smokeless tobacco: Never Used  Vaping Use  . Vaping Use: Never used  Substance and Sexual Activity  . Alcohol use: Yes    Comment: moderate social drinking  . Drug use: No  . Sexual activity: Not on file  Other Topics Concern  . Not on file  Social History Narrative  . Not on  file   Social Determinants of Health   Financial Resource Strain:   . Difficulty of Paying Living Expenses: Not on file  Food Insecurity:   . Worried About Charity fundraiser in the Last Year: Not on file  . Ran Out of Food in the Last Year: Not on file  Transportation Needs:   . Lack of Transportation (Medical): Not on file  . Lack of Transportation (Non-Medical): Not on file  Physical Activity:   . Days of Exercise per Week: Not on file  . Minutes of Exercise per Session: Not on file  Stress:   . Feeling of Stress : Not on file  Social Connections:   . Frequency of Communication with Friends and Family: Not on file  . Frequency of Social Gatherings with Friends and Family: Not on file  . Attends Religious Services: Not on file  . Active Member of Clubs or Organizations: Not on file  . Attends Archivist Meetings: Not on file  . Marital Status: Not on file  Intimate Partner Violence:   . Fear of Current or Ex-Partner: Not on file  . Emotionally Abused: Not on file  . Physically Abused: Not  on file  . Sexually Abused: Not on file    Outpatient Medications Prior to Visit  Medication Sig Dispense Refill  . allopurinol (ZYLOPRIM) 100 MG tablet TAKE 4 TABLETS BY MOUTH ONCE DAILY 360 tablet 3  . colchicine 0.6 MG tablet Take two at onset and then one po bid prn acute gout flares. 60 tablet 1  . losartan (COZAAR) 50 MG tablet Take 1 tablet (50 mg total) by mouth daily. 90 tablet 3   No facility-administered medications prior to visit.    No Known Allergies  ROS Review of Systems  Constitutional: Negative for fatigue.  Eyes: Negative for visual disturbance.  Respiratory: Negative for cough, chest tightness and shortness of breath.   Cardiovascular: Negative for chest pain, palpitations and leg swelling.  Neurological: Negative for dizziness, syncope, weakness, light-headedness and headaches.      Objective:    Physical Exam Constitutional:      Appearance:  He is well-developed.  HENT:     Right Ear: External ear normal.     Left Ear: External ear normal.  Eyes:     Pupils: Pupils are equal, round, and reactive to light.  Neck:     Thyroid: No thyromegaly.  Cardiovascular:     Rate and Rhythm: Normal rate and regular rhythm.  Pulmonary:     Effort: Pulmonary effort is normal. No respiratory distress.     Breath sounds: Normal breath sounds. No wheezing or rales.  Musculoskeletal:     Cervical back: Neck supple.  Neurological:     Mental Status: He is alert and oriented to person, place, and time.     BP 126/80 (BP Location: Left Arm, Patient Position: Sitting, Cuff Size: Normal)   Pulse 60   Temp 98.1 F (36.7 C) (Oral)   Ht 5\' 8"  (1.727 m)   Wt 202 lb 14.4 oz (92 kg)   SpO2 97%   BMI 30.85 kg/m  Wt Readings from Last 3 Encounters:  06/29/20 202 lb 14.4 oz (92 kg)  06/16/20 213 lb 6.4 oz (96.8 kg)  05/26/20 213 lb 6.4 oz (96.8 kg)     There are no preventive care reminders to display for this patient.  There are no preventive care reminders to display for this patient.  Lab Results  Component Value Date   TSH 3.62 05/22/2019   Lab Results  Component Value Date   WBC 6.1 05/22/2019   HGB 15.6 05/22/2019   HCT 45.3 05/22/2019   MCV 87.9 05/22/2019   PLT 217.0 05/22/2019   Lab Results  Component Value Date   NA 139 05/22/2019   K 4.1 05/22/2019   CO2 26 05/22/2019   GLUCOSE 99 05/22/2019   BUN 19 05/22/2019   CREATININE 1.05 05/22/2019   BILITOT 0.6 05/22/2019   ALKPHOS 81 05/22/2019   AST 34 05/22/2019   ALT 55 (H) 05/22/2019   PROT 7.0 05/22/2019   ALBUMIN 4.8 05/22/2019   CALCIUM 9.7 05/22/2019   GFR 73.32 05/22/2019   Lab Results  Component Value Date   CHOL 246 (H) 05/22/2019   Lab Results  Component Value Date   HDL 46.10 05/22/2019   Lab Results  Component Value Date   LDLCALC 152 (H) 04/03/2017   Lab Results  Component Value Date   TRIG 239.0 (H) 05/22/2019   Lab Results    Component Value Date   CHOLHDL 5 05/22/2019   No results found for: HGBA1C    Assessment & Plan:   Problem List Items  Addressed This Visit      Unprioritized   Tubular adenoma   Essential hypertension   Relevant Medications   losartan (COZAAR) 50 MG tablet   Gout   Relevant Orders   Uric Acid    Other Visit Diagnoses    Need for influenza vaccination    -  Primary   Relevant Orders   Flu Vaccine QUAD 6+ mos PF IM (Fluarix Quad PF) (Completed)    -Gout is stable on allopurinol.  Recheck uric acid level.  Continue allopurinol 400 mg daily  -Refill losartan for 1 year  -We will likely need repeat colonoscopy in 3 years  -Set up complete physical in 6 months  Meds ordered this encounter  Medications  . losartan (COZAAR) 50 MG tablet    Sig: Take 1 tablet (50 mg total) by mouth daily.    Dispense:  30 tablet    Refill:  11    Follow-up: No follow-ups on file.    Carolann Littler, MD

## 2020-06-30 LAB — URIC ACID: Uric Acid, Serum: 6.1 mg/dL (ref 4.0–8.0)

## 2020-07-01 ENCOUNTER — Telehealth: Payer: Self-pay | Admitting: *Deleted

## 2020-07-01 NOTE — Telephone Encounter (Signed)
Called and spoke with pt about lab results, informed pt that  uric acid level was 6.1 per Dr.Burchette no changes to meds at this time.

## 2020-07-01 NOTE — Telephone Encounter (Signed)
Patient returned call to [facility]

## 2020-09-12 ENCOUNTER — Other Ambulatory Visit: Payer: Self-pay | Admitting: Family Medicine

## 2020-10-11 ENCOUNTER — Telehealth: Payer: Self-pay | Admitting: Family Medicine

## 2020-10-11 DIAGNOSIS — M1A09X0 Idiopathic chronic gout, multiple sites, without tophus (tophi): Secondary | ICD-10-CM

## 2020-10-11 NOTE — Telephone Encounter (Addendum)
Pt call and stated he want a call back about his medications and want to change his Pharmacy to  [facility] .

## 2020-10-11 NOTE — Telephone Encounter (Signed)
No answer at the pts cell number. °

## 2020-10-12 MED ORDER — ALLOPURINOL 100 MG PO TABS: ORAL_TABLET | ORAL | 3 refills | Status: DC

## 2020-10-12 MED ORDER — LOSARTAN POTASSIUM 50 MG PO TABS
50.0000 mg | ORAL_TABLET | Freq: Every day | ORAL | 11 refills | Status: DC
Start: 2020-10-12 — End: 2021-08-29

## 2020-10-12 MED ORDER — COLCHICINE 0.6 MG PO TABS: ORAL_TABLET | ORAL | 1 refills | Status: AC

## 2020-10-12 NOTE — Telephone Encounter (Signed)
Refill request received from [facility].  Refills sent.

## 2020-12-13 ENCOUNTER — Other Ambulatory Visit: Payer: Self-pay

## 2020-12-13 ENCOUNTER — Ambulatory Visit (INDEPENDENT_AMBULATORY_CARE_PROVIDER_SITE_OTHER): Payer: Managed Care, Other (non HMO) | Admitting: Family Medicine

## 2020-12-13 ENCOUNTER — Other Ambulatory Visit (HOSPITAL_COMMUNITY)
Admission: RE | Admit: 2020-12-13 | Discharge: 2020-12-13 | Disposition: A | Discharge status: Home / Self Care | Payer: Managed Care, Other (non HMO) | Source: Ambulatory Visit | Attending: Family Medicine | Admitting: Family Medicine

## 2020-12-13 ENCOUNTER — Encounter: Payer: Self-pay | Admitting: Family Medicine

## 2020-12-13 VITALS — BP 162/82 | HR 67 | Temp 97.6°F | Wt 214.3 lb

## 2020-12-13 DIAGNOSIS — M1A9XX1 Chronic gout, unspecified, with tophus (tophi): Secondary | ICD-10-CM

## 2020-12-13 DIAGNOSIS — I10 Essential (primary) hypertension: Secondary | ICD-10-CM

## 2020-12-13 DIAGNOSIS — D369 Benign neoplasm, unspecified site: Secondary | ICD-10-CM | POA: Diagnosis not present

## 2020-12-13 DIAGNOSIS — Z113 Encounter for screening for infections with a predominantly sexual mode of transmission: Secondary | ICD-10-CM | POA: Diagnosis not present

## 2020-12-13 NOTE — Patient Instructions (Signed)
Monitor blood pressure and be in touch if consistently > 140/90.   

## 2020-12-13 NOTE — Progress Notes (Signed)
Established Patient Office Visit  Subjective:  Patient ID: James Monroe, male    DOB: [birth year]  Age: 55 y.o. MRN: [id number]  CC: No chief complaint on file.   HPI James Monroe presents for several items as below  Requesting STD screening.  He has a new relationship his hand.  He denies any symptoms whatsoever in terms of any urinary problems, penile discharge, or rash.  He simply wants to be screened to make sure he has no evidence for anything currently.  He is not sure if he has had previous hepatitis B vaccine.  History of gout.  He is done extremely well on allopurinol.  No recent flareups.  Hypertension treated with losartan 50 mg daily.  Not monitoring blood pressures currently.  Initial blood pressure here 162/82 but repeat after rest 142/90.  He is doing some aerobic exercise with walking and hiking.  No headaches or dizziness.  History of adenomatous polyps.  He has some questions regarding his prior colonoscopy from last fall.  He states his maternal grandmother had colon cancer around age 19 and has had 2 uncles with colon cancer.  Patient had 2 adenomatous polyps with tentative follow-up for 3 years from now.  Past Medical History:  Diagnosis Date  . Chicken pox   . Concussion 2001  . Gout   . Hyperlipidemia   . Hypertension     Past Surgical History:  Procedure Laterality Date  . COLONOSCOPY      Family History  Problem Relation Age of Onset  . Heart disease Father 76       bypass surgery 1995  . Hypertension Father   . Colon cancer Maternal Grandmother   . Esophageal cancer Neg Hx   . Rectal cancer Neg Hx   . Stomach cancer Neg Hx     Social History   Socioeconomic History  . Marital status: Single    Spouse name: Not on file  . Number of children: Not on file  . Years of education: Not on file  . Highest education level: Not on file  Occupational History  . Not on file  Tobacco Use  . Smoking status: Never Smoker  . Smokeless tobacco: Never Used   Vaping Use  . Vaping Use: Never used  Substance and Sexual Activity  . Alcohol use: Yes    Comment: moderate social drinking  . Drug use: No  . Sexual activity: Not on file  Other Topics Concern  . Not on file  Social History Narrative  . Not on file   Social Determinants of Health   Financial Resource Strain: Not on file  Food Insecurity: Not on file  Transportation Needs: Not on file  Physical Activity: Not on file  Stress: Not on file  Social Connections: Not on file  Intimate Partner Violence: Not on file    Outpatient Medications Prior to Visit  Medication Sig Dispense Refill  . allopurinol (ZYLOPRIM) 100 MG tablet TAKE 4 TABLETS BY MOUTH ONCE DAILY 360 tablet 3  . colchicine 0.6 MG tablet Take two at onset and then one po bid prn acute gout flares. 60 tablet 1  . losartan (COZAAR) 50 MG tablet Take 1 tablet (50 mg total) by mouth daily. 30 tablet 11   No facility-administered medications prior to visit.    No Known Allergies  ROS Review of Systems  Constitutional: Negative for appetite change, chills, fever and unexpected weight change.  Respiratory: Negative for cough and shortness of breath.   Cardiovascular:  Negative for chest pain.  Genitourinary: Negative for dysuria, genital sores and penile discharge.  Hematological: Negative for adenopathy.      Objective:    Physical Exam Vitals reviewed.  Constitutional:      Appearance: Normal appearance.  Cardiovascular:     Rate and Rhythm: Normal rate and regular rhythm.  Pulmonary:     Effort: Pulmonary effort is normal.     Breath sounds: Normal breath sounds.  Neurological:     Mental Status: He is alert.     BP (!) 162/82 (BP Location: Left Arm, Patient Position: Sitting, Cuff Size: Normal)   Pulse 67   Temp 97.6 F (36.4 C) (Oral)   Wt 214 lb 4.8 oz (97.2 kg)   SpO2 99%   BMI 32.58 kg/m  Wt Readings from Last 3 Encounters:  12/13/20 214 lb 4.8 oz (97.2 kg)  06/29/20 202 lb 14.4 oz (92  kg)  06/16/20 213 lb 6.4 oz (96.8 kg)     Health Maintenance Due  Topic Date Due  . COVID-19 Vaccine (3 - Booster for Pfizer series) 06/16/2020    There are no preventive care reminders to display for this patient.  Lab Results  Component Value Date   TSH 3.62 05/22/2019   Lab Results  Component Value Date   WBC 6.1 05/22/2019   HGB 15.6 05/22/2019   HCT 45.3 05/22/2019   MCV 87.9 05/22/2019   PLT 217.0 05/22/2019   Lab Results  Component Value Date   NA 139 05/22/2019   K 4.1 05/22/2019   CO2 26 05/22/2019   GLUCOSE 99 05/22/2019   BUN 19 05/22/2019   CREATININE 1.05 05/22/2019   BILITOT 0.6 05/22/2019   ALKPHOS 81 05/22/2019   AST 34 05/22/2019   ALT 55 (H) 05/22/2019   PROT 7.0 05/22/2019   ALBUMIN 4.8 05/22/2019   CALCIUM 9.7 05/22/2019   GFR 73.32 05/22/2019   Lab Results  Component Value Date   CHOL 246 (H) 05/22/2019   Lab Results  Component Value Date   HDL 46.10 05/22/2019   Lab Results  Component Value Date   LDLCALC 152 (H) 04/03/2017   Lab Results  Component Value Date   TRIG 239.0 (H) 05/22/2019   Lab Results  Component Value Date   CHOLHDL 5 05/22/2019   No results found for: HGBA1C    Assessment & Plan:   #1 STD screening.  Patient asymptomatic. -Check HIV, RPR, urine cytology for GC and chlamydia, hepatitis B surface antigen and antibody  #2 gout which is controlled with allopurinol.  Most recent uric acid level 6.1.  No recent flareups.  Continue current medications  #3 hypertension.  Borderline elevation today on repeat reading. -Suggest that he get home monitor and be in touch if consistent readings greater than 140/90 -Continue regular aerobic exercise -Keep sodium intake less than 2400 mg daily    No orders of the defined types were placed in this encounter.   Follow-up: No follow-ups on file.    Carolann Littler, MD

## 2020-12-14 LAB — RPR: RPR Ser Ql: NONREACTIVE

## 2020-12-14 LAB — HIV ANTIBODY (ROUTINE TESTING W REFLEX): HIV 1&2 Ab, 4th Generation: NONREACTIVE

## 2020-12-14 LAB — HEPATITIS B SURFACE ANTIGEN: Hepatitis B Surface Ag: NONREACTIVE

## 2020-12-14 LAB — HEPATITIS B SURFACE ANTIBODY,QUALITATIVE: Hep B S Ab: NONREACTIVE

## 2020-12-15 LAB — URINE CYTOLOGY ANCILLARY ONLY
Chlamydia: NEGATIVE
Comment: NEGATIVE
Comment: NORMAL
Neisseria Gonorrhea: NEGATIVE

## 2021-08-26 ENCOUNTER — Other Ambulatory Visit: Payer: Self-pay | Admitting: Family Medicine

## 2021-09-04 ENCOUNTER — Encounter: Payer: Managed Care, Other (non HMO) | Admitting: Family Medicine

## 2021-10-02 ENCOUNTER — Encounter: Payer: Managed Care, Other (non HMO) | Admitting: Family Medicine

## 2021-10-18 ENCOUNTER — Ambulatory Visit (INDEPENDENT_AMBULATORY_CARE_PROVIDER_SITE_OTHER): Payer: Managed Care, Other (non HMO) | Admitting: Family Medicine

## 2021-10-18 ENCOUNTER — Encounter: Payer: Self-pay | Admitting: Family Medicine

## 2021-10-18 VITALS — BP 130/90 | HR 65 | Temp 98.0°F | Ht 69.5 in | Wt 219.6 lb

## 2021-10-18 DIAGNOSIS — Z125 Encounter for screening for malignant neoplasm of prostate: Secondary | ICD-10-CM | POA: Diagnosis not present

## 2021-10-18 DIAGNOSIS — Z0000 Encounter for general adult medical examination without abnormal findings: Secondary | ICD-10-CM

## 2021-10-18 DIAGNOSIS — Z23 Encounter for immunization: Secondary | ICD-10-CM | POA: Diagnosis not present

## 2021-10-18 LAB — LIPID PANEL
Cholesterol: 247 mg/dL — ABNORMAL HIGH (ref 0–200)
HDL: 48.8 mg/dL (ref >39.00)
LDL Cholesterol: 163 mg/dL — ABNORMAL HIGH (ref 0–99)
NonHDL: 198.14
Total CHOL/HDL Ratio: 5
Triglycerides: 174 mg/dL — ABNORMAL HIGH (ref 0.0–149.0)
VLDL: 34.8 mg/dL (ref 0.0–40.0)

## 2021-10-18 LAB — BASIC METABOLIC PANEL
BUN: 16 mg/dL (ref 6–23)
CO2: 28 mEq/L (ref 19–32)
Calcium: 9.7 mg/dL (ref 8.4–10.5)
Chloride: 105 mEq/L (ref 96–112)
Creatinine, Ser: 1.05 mg/dL (ref 0.40–1.50)
GFR: 78.82 mL/min (ref >60.00)
Glucose, Bld: 113 mg/dL — ABNORMAL HIGH (ref 70–99)
Potassium: 4.3 mEq/L (ref 3.5–5.1)
Sodium: 139 mEq/L (ref 135–145)

## 2021-10-18 LAB — CBC WITH DIFFERENTIAL/PLATELET
Basophils Absolute: 0 10*3/uL (ref 0.0–0.1)
Basophils Relative: 0.9 % (ref 0.0–3.0)
Eosinophils Absolute: 0.4 10*3/uL (ref 0.0–0.7)
Eosinophils Relative: 6.6 % — ABNORMAL HIGH (ref 0.0–5.0)
HCT: 43.9 % (ref 39.0–52.0)
Hemoglobin: 14.7 g/dL (ref 13.0–17.0)
Lymphocytes Relative: 41.6 % (ref 12.0–46.0)
Lymphs Abs: 2.2 10*3/uL (ref 0.7–4.0)
MCHC: 33.4 g/dL (ref 30.0–36.0)
MCV: 88.2 fl (ref 78.0–100.0)
Monocytes Absolute: 0.4 10*3/uL (ref 0.1–1.0)
Monocytes Relative: 7.5 % (ref 3.0–12.0)
Neutro Abs: 2.3 10*3/uL (ref 1.4–7.7)
Neutrophils Relative %: 43.4 % (ref 43.0–77.0)
Platelets: 203 10*3/uL (ref 150.0–400.0)
RBC: 4.98 Mil/uL (ref 4.22–5.81)
RDW: 13.9 % (ref 11.5–15.5)
WBC: 5.3 10*3/uL (ref 4.0–10.5)

## 2021-10-18 LAB — TSH: TSH: 3.39 u[IU]/mL (ref 0.35–5.50)

## 2021-10-18 LAB — HEPATIC FUNCTION PANEL
ALT: 35 U/L (ref 0–53)
AST: 23 U/L (ref 0–37)
Albumin: 4.8 g/dL (ref 3.5–5.2)
Alkaline Phosphatase: 72 U/L (ref 39–117)
Bilirubin, Direct: 0.1 mg/dL (ref 0.0–0.3)
Total Bilirubin: 0.6 mg/dL (ref 0.2–1.2)
Total Protein: 6.9 g/dL (ref 6.0–8.3)

## 2021-10-18 LAB — PSA: PSA: 0.52 ng/mL (ref 0.10–4.00)

## 2021-10-18 LAB — URIC ACID: Uric Acid, Serum: 5.7 mg/dL (ref 4.0–7.8)

## 2021-10-18 NOTE — Patient Instructions (Signed)
Monitor blood pressure and be in touch if consistently > 140/90.   

## 2021-10-18 NOTE — Addendum Note (Signed)
Addended by: Fulton Mole D on: 10/18/2021 09:06 AM   Modules accepted: Orders

## 2021-10-18 NOTE — Progress Notes (Signed)
Established Patient Office Visit  Subjective:  Patient ID: James Monroe, male    DOB: [birth year]  Age: 56 y.o. MRN: [id number]  CC:  Chief Complaint  Patient presents with   Annual Exam    HPI James Monroe presents for annual physical exam.   He is generally doing well.  He has hypertension treated with losartan.  Gout and treated with allopurinol.  No recent gout flare.  He has been staying very active.  Does some Trail running.  Currently not monitoring blood pressure regularly.  Positive family history of CAD as below.  Patient had coronary calcium score of 0.  Health maintenance reviewed:  -Declines flu vaccine -Shingrix already given -Previous hepatitis C screen negative -Colonoscopy due 10/24 -Tetanus due now  Family history-mother had Alzheimer's dementia.  This was late onset.  Father had bypass age 37.  He is in his mid 61s.  His father has hypertension history.  He has 2 sisters alive and well.  Social history-non-smoker.  No regular alcohol.   Past Medical History:  Diagnosis Date   Chicken pox    Concussion 2001   Gout    Hyperlipidemia    Hypertension     Past Surgical History:  Procedure Laterality Date   COLONOSCOPY      Family History  Problem Relation Age of Onset   Stroke Mother    Alzheimer's disease Mother    Heart disease Father 47       bypass surgery 1995   Hypertension Father    Colon cancer Maternal Grandmother    Esophageal cancer Neg Hx    Rectal cancer Neg Hx    Stomach cancer Neg Hx     Social History   Socioeconomic History   Marital status: Single    Spouse name: Not on file   Number of children: Not on file   Years of education: Not on file   Highest education level: Not on file  Occupational History   Not on file  Tobacco Use   Smoking status: Never   Smokeless tobacco: Never  Vaping Use   Vaping Use: Never used  Substance and Sexual Activity   Alcohol use: Yes    Comment: moderate social drinking   Drug use: No    Sexual activity: Not on file  Other Topics Concern   Not on file  Social History Narrative   Not on file   Social Determinants of Health   Financial Resource Strain: Not on file  Food Insecurity: Not on file  Transportation Needs: Not on file  Physical Activity: Not on file  Stress: Not on file  Social Connections: Not on file  Intimate Partner Violence: Not on file    Outpatient Medications Prior to Visit  Medication Sig Dispense Refill   allopurinol (ZYLOPRIM) 100 MG tablet TAKE 4 TABLETS BY MOUTH ONCE DAILY 360 tablet 3   colchicine 0.6 MG tablet Take two at onset and then one po bid prn acute gout flares. 60 tablet 1   losartan (COZAAR) 50 MG tablet TAKE 1 TABLET BY MOUTH EVERY DAY 90 tablet 3   No facility-administered medications prior to visit.    No Known Allergies  ROS Review of Systems  Constitutional:  Negative for activity change, appetite change, fatigue and fever.  HENT:  Negative for congestion, ear pain and trouble swallowing.   Eyes:  Negative for pain and visual disturbance.  Respiratory:  Negative for cough, shortness of breath and wheezing.   Cardiovascular:  Negative  for chest pain and palpitations.  Gastrointestinal:  Negative for abdominal distention, abdominal pain, blood in stool, constipation, diarrhea, nausea, rectal pain and vomiting.  Genitourinary:  Negative for dysuria, hematuria and testicular pain.  Musculoskeletal:  Negative for arthralgias and joint swelling.  Skin:  Negative for rash.  Neurological:  Negative for dizziness, syncope and headaches.  Hematological:  Negative for adenopathy.  Psychiatric/Behavioral:  Negative for confusion and dysphoric mood.      Objective:    Physical Exam Constitutional:      General: He is not in acute distress.    Appearance: He is well-developed.  HENT:     Head: Normocephalic and atraumatic.     Right Ear: External ear normal.     Left Ear: External ear normal.  Eyes:      Conjunctiva/sclera: Conjunctivae normal.     Pupils: Pupils are equal, round, and reactive to light.  Neck:     Thyroid: No thyromegaly.  Cardiovascular:     Rate and Rhythm: Normal rate and regular rhythm.     Heart sounds: Normal heart sounds. No murmur heard. Pulmonary:     Effort: No respiratory distress.     Breath sounds: No wheezing or rales.  Abdominal:     General: Bowel sounds are normal. There is no distension.     Palpations: Abdomen is soft. There is no mass.     Tenderness: There is no abdominal tenderness. There is no guarding or rebound.  Musculoskeletal:     Cervical back: Normal range of motion and neck supple.  Lymphadenopathy:     Cervical: No cervical adenopathy.  Skin:    Findings: No rash.  Neurological:     Mental Status: He is alert and oriented to person, place, and time.     Cranial Nerves: No cranial nerve deficit.    BP 130/90 (BP Location: Left Arm, Patient Position: Sitting, Cuff Size: Normal)    Pulse 65    Temp 98 F (36.7 C) (Oral)    Ht 5' 9.5" (1.765 m)    Wt 219 lb 9.6 oz (99.6 kg)    SpO2 98%    BMI 31.96 kg/m  Wt Readings from Last 3 Encounters:  10/18/21 219 lb 9.6 oz (99.6 kg)  12/13/20 214 lb 4.8 oz (97.2 kg)  06/29/20 202 lb 14.4 oz (92 kg)     There are no preventive care reminders to display for this patient.  There are no preventive care reminders to display for this patient.  Lab Results  Component Value Date   TSH 3.62 05/22/2019   Lab Results  Component Value Date   WBC 6.1 05/22/2019   HGB 15.6 05/22/2019   HCT 45.3 05/22/2019   MCV 87.9 05/22/2019   PLT 217.0 05/22/2019   Lab Results  Component Value Date   NA 139 05/22/2019   K 4.1 05/22/2019   CO2 26 05/22/2019   GLUCOSE 99 05/22/2019   BUN 19 05/22/2019   CREATININE 1.05 05/22/2019   BILITOT 0.6 05/22/2019   ALKPHOS 81 05/22/2019   AST 34 05/22/2019   ALT 55 (H) 05/22/2019   PROT 7.0 05/22/2019   ALBUMIN 4.8 05/22/2019   CALCIUM 9.7 05/22/2019    GFR 73.32 05/22/2019   Lab Results  Component Value Date   CHOL 246 (H) 05/22/2019   Lab Results  Component Value Date   HDL 46.10 05/22/2019   Lab Results  Component Value Date   LDLCALC 152 (H) 04/03/2017   Lab Results  Component Value Date   TRIG 239.0 (H) 05/22/2019   Lab Results  Component Value Date   CHOLHDL 5 05/22/2019   No results found for: HGBA1C    Assessment & Plan:   Problem List Items Addressed This Visit   None Visit Diagnoses     Physical exam    -  Primary   Relevant Orders   Basic metabolic panel   Lipid panel   CBC with Differential/Platelet   TSH   Hepatic function panel   PSA   Uric Acid     56 year old male with history of gout which has been stable on allopurinol.  He has hypertension.  His blood pressure is slightly elevated today and repeat left arm after rest 142/88.  We recommend the following  -Consider home blood pressure cuff and monitor over the next month or 2.  If consistent readings greater than 140/90 be in touch. -Continue regular exercise habits -Obtain screening labs as above -Tetanus booster given -Offered flu vaccine and he declines.  No orders of the defined types were placed in this encounter.   Follow-up: No follow-ups on file.    Carolann Littler, MD

## 2021-10-20 ENCOUNTER — Other Ambulatory Visit: Payer: Self-pay | Admitting: Family Medicine

## 2021-10-20 DIAGNOSIS — M1A09X0 Idiopathic chronic gout, multiple sites, without tophus (tophi): Secondary | ICD-10-CM

## 2022-01-05 ENCOUNTER — Encounter: Payer: Self-pay | Admitting: Family Medicine

## 2022-08-13 ENCOUNTER — Telehealth: Payer: Self-pay | Admitting: Family Medicine

## 2022-08-13 NOTE — Telephone Encounter (Signed)
Pt is calling and would like dr burchette recommendation as to which male provider his 56 year old male friend should est with

## 2022-08-13 NOTE — Telephone Encounter (Signed)
Left detailed message on patient's voicemail informing him of the message below

## 2022-08-13 NOTE — Telephone Encounter (Signed)
Please see prior note

## 2022-08-13 NOTE — Telephone Encounter (Signed)
Left a message for the pt to return my call.  

## 2022-08-13 NOTE — Telephone Encounter (Signed)
Returning call for doctor recommendation. Says you can leave it on the voicemail.

## 2022-10-21 ENCOUNTER — Other Ambulatory Visit: Payer: Self-pay | Admitting: Family Medicine

## 2022-11-09 ENCOUNTER — Other Ambulatory Visit: Payer: Self-pay | Admitting: Family Medicine

## 2022-11-09 DIAGNOSIS — M1A09X0 Idiopathic chronic gout, multiple sites, without tophus (tophi): Secondary | ICD-10-CM

## 2022-11-22 ENCOUNTER — Other Ambulatory Visit: Payer: Self-pay | Admitting: Family Medicine

## 2022-11-22 DIAGNOSIS — M1A09X0 Idiopathic chronic gout, multiple sites, without tophus (tophi): Secondary | ICD-10-CM

## 2022-11-26 ENCOUNTER — Other Ambulatory Visit: Payer: Self-pay | Admitting: Family Medicine

## 2022-11-30 ENCOUNTER — Other Ambulatory Visit: Payer: Self-pay | Admitting: Family Medicine

## 2022-12-03 ENCOUNTER — Telehealth: Payer: Self-pay | Admitting: Family Medicine

## 2022-12-03 NOTE — Telephone Encounter (Signed)
Prescription Request  12/03/2022  LOV: Visit date not found  What is the name of the medication or equipment?  losartan (COZAAR) 50 MG tablet  Pt called to say he is almost out of this Rx.   Pt contacted Pharmacy and was told to reach out to his PCP. Pt informed that PCP will be out until June 2024. Pt asked if another provider can assist with this refill? Please advise.   Have you contacted your pharmacy to request a refill? Yes   Which pharmacy would you like this sent to?  [id number] - [place], [place] - [address] AT [address] OF [facility] [address] [place] [address] Phone: [phone] Fax: [phone]    Patient notified that their request is being sent to the clinical staff for review and that they should receive a response within 2 business days.   Please advise at Mobile [phone] (mobile)

## 2022-12-03 NOTE — Telephone Encounter (Signed)
Pt states he only has 3-4 days worth of the Losartan losartan (COZAAR) 50 MG tablet  Pt has already been scheduled for 12/24/22 with Dr. Clent Ridges.  Pt would like to know if MD would be able to refill, just until he comes in on 12/24/22?  Please advise.  [id number] - [place], [place] - [address] AT [facility] [address] [facility] [address] Phone: [phone]  Fax: [phone]

## 2022-12-05 MED ORDER — LOSARTAN POTASSIUM 50 MG PO TABS: 50.0000 mg | ORAL_TABLET | Freq: Every day | ORAL | 0 refills | Status: DC

## 2022-12-05 NOTE — Telephone Encounter (Signed)
Rx sent 

## 2022-12-07 ENCOUNTER — Other Ambulatory Visit: Payer: Self-pay | Admitting: Family Medicine

## 2022-12-24 ENCOUNTER — Ambulatory Visit (INDEPENDENT_AMBULATORY_CARE_PROVIDER_SITE_OTHER): Payer: Managed Care, Other (non HMO) | Admitting: Family Medicine

## 2022-12-24 ENCOUNTER — Encounter: Payer: Self-pay | Admitting: Family Medicine

## 2022-12-24 VITALS — BP 126/80 | HR 61 | Temp 98.0°F | Ht 69.5 in | Wt 223.0 lb

## 2022-12-24 DIAGNOSIS — M1A09X0 Idiopathic chronic gout, multiple sites, without tophus (tophi): Secondary | ICD-10-CM | POA: Diagnosis not present

## 2022-12-24 DIAGNOSIS — Z0000 Encounter for general adult medical examination without abnormal findings: Secondary | ICD-10-CM | POA: Diagnosis not present

## 2022-12-24 LAB — CBC WITH DIFFERENTIAL/PLATELET
Basophils Absolute: 0 10*3/uL (ref 0.0–0.1)
Basophils Relative: 0.6 % (ref 0.0–3.0)
Eosinophils Absolute: 0.2 10*3/uL (ref 0.0–0.7)
Eosinophils Relative: 3.8 % (ref 0.0–5.0)
HCT: 44.7 % (ref 39.0–52.0)
Hemoglobin: 15.3 g/dL (ref 13.0–17.0)
Lymphocytes Relative: 41.2 % (ref 12.0–46.0)
Lymphs Abs: 2.3 10*3/uL (ref 0.7–4.0)
MCHC: 34.1 g/dL (ref 30.0–36.0)
MCV: 88.5 fl (ref 78.0–100.0)
Monocytes Absolute: 0.4 10*3/uL (ref 0.1–1.0)
Monocytes Relative: 6.3 % (ref 3.0–12.0)
Neutro Abs: 2.7 10*3/uL (ref 1.4–7.7)
Neutrophils Relative %: 48.1 % (ref 43.0–77.0)
Platelets: 213 10*3/uL (ref 150.0–400.0)
RBC: 5.05 Mil/uL (ref 4.22–5.81)
RDW: 13.8 % (ref 11.5–15.5)
WBC: 5.7 10*3/uL (ref 4.0–10.5)

## 2022-12-24 LAB — LIPID PANEL
Cholesterol: 245 mg/dL — ABNORMAL HIGH (ref 0–200)
HDL: 47.7 mg/dL (ref >39.00)
LDL Cholesterol: 166 mg/dL — ABNORMAL HIGH (ref 0–99)
NonHDL: 197.55
Total CHOL/HDL Ratio: 5
Triglycerides: 158 mg/dL — ABNORMAL HIGH (ref 0.0–149.0)
VLDL: 31.6 mg/dL (ref 0.0–40.0)

## 2022-12-24 LAB — BASIC METABOLIC PANEL
BUN: 19 mg/dL (ref 6–23)
CO2: 25 mEq/L (ref 19–32)
Calcium: 9.4 mg/dL (ref 8.4–10.5)
Chloride: 106 mEq/L (ref 96–112)
Creatinine, Ser: 1 mg/dL (ref 0.40–1.50)
GFR: 82.88 mL/min (ref >60.00)
Glucose, Bld: 95 mg/dL (ref 70–99)
Potassium: 4.1 mEq/L (ref 3.5–5.1)
Sodium: 141 mEq/L (ref 135–145)

## 2022-12-24 LAB — HEPATIC FUNCTION PANEL
ALT: 34 U/L (ref 0–53)
AST: 20 U/L (ref 0–37)
Albumin: 4.6 g/dL (ref 3.5–5.2)
Alkaline Phosphatase: 78 U/L (ref 39–117)
Bilirubin, Direct: 0.1 mg/dL (ref 0.0–0.3)
Total Bilirubin: 0.4 mg/dL (ref 0.2–1.2)
Total Protein: 6.9 g/dL (ref 6.0–8.3)

## 2022-12-24 LAB — PSA: PSA: 0.72 ng/mL (ref 0.10–4.00)

## 2022-12-24 LAB — HEMOGLOBIN A1C: Hgb A1c MFr Bld: 5.8 % (ref 4.6–6.5)

## 2022-12-24 LAB — URIC ACID: Uric Acid, Serum: 6.7 mg/dL (ref 4.0–7.8)

## 2022-12-24 LAB — TSH: TSH: 4.52 u[IU]/mL (ref 0.35–5.50)

## 2022-12-24 MED ORDER — LOSARTAN POTASSIUM 50 MG PO TABS: 50.0000 mg | ORAL_TABLET | Freq: Every day | ORAL | 0 refills | Status: DC

## 2022-12-24 MED ORDER — ALLOPURINOL 100 MG PO TABS: 400.0000 mg | ORAL_TABLET | Freq: Every day | ORAL | 0 refills | Status: DC

## 2022-12-24 NOTE — Progress Notes (Signed)
Subjective:    Patient ID: James Monroe, male    DOB: November 21, 1965, 57 y.o.   MRN: [id number]  HPI Here for a well exam. He feels fine. His gout is very well controlled.    Review of Systems  Constitutional: Negative.   HENT: Negative.    Eyes: Negative.   Respiratory: Negative.    Cardiovascular: Negative.   Gastrointestinal: Negative.   Genitourinary: Negative.   Musculoskeletal: Negative.   Skin: Negative.   Neurological: Negative.   Psychiatric/Behavioral: Negative.         Objective:   Physical Exam Constitutional:      General: He is not in acute distress.    Appearance: Normal appearance. He is well-developed. He is not diaphoretic.  HENT:     Head: Normocephalic and atraumatic.     Right Ear: External ear normal.     Left Ear: External ear normal.     Nose: Nose normal.     Mouth/Throat:     Pharynx: No oropharyngeal exudate.  Eyes:     General: No scleral icterus.       Right eye: No discharge.        Left eye: No discharge.     Conjunctiva/sclera: Conjunctivae normal.     Pupils: Pupils are equal, round, and reactive to light.  Neck:     Thyroid: No thyromegaly.     Vascular: No JVD.     Trachea: No tracheal deviation.  Cardiovascular:     Rate and Rhythm: Normal rate and regular rhythm.     Pulses: Normal pulses.     Heart sounds: Normal heart sounds. No murmur heard.    No friction rub. No gallop.  Pulmonary:     Effort: Pulmonary effort is normal. No respiratory distress.     Breath sounds: Normal breath sounds. No wheezing or rales.  Chest:     Chest wall: No tenderness.  Abdominal:     General: Abdomen is flat. Bowel sounds are normal. There is no distension.     Palpations: Abdomen is soft. There is no mass.     Tenderness: There is no abdominal tenderness. There is no guarding or rebound.  Genitourinary:    Penis: Normal. No tenderness.      Testes: Normal.     Prostate: Normal.     Rectum: Normal. Guaiac result negative.  Musculoskeletal:         General: No tenderness. Normal range of motion.     Cervical back: Neck supple.  Lymphadenopathy:     Cervical: No cervical adenopathy.  Skin:    General: Skin is warm and dry.     Coloration: Skin is not pale.     Findings: No erythema or rash.  Neurological:     General: No focal deficit present.     Mental Status: He is alert and oriented to person, place, and time.     Cranial Nerves: No cranial nerve deficit.     Motor: No abnormal muscle tone.     Coordination: Coordination normal.     Deep Tendon Reflexes: Reflexes are normal and symmetric. Reflexes normal.  Psychiatric:        Mood and Affect: Mood normal.        Behavior: Behavior normal.        Thought Content: Thought content normal.        Judgment: Judgment normal.           Assessment & Plan:  Well exam. We discussed  diet and exercise. Get fasting labs.  Alysia Penna, MD

## 2023-04-02 ENCOUNTER — Other Ambulatory Visit: Payer: Self-pay | Admitting: Family Medicine

## 2023-04-02 DIAGNOSIS — M1A09X0 Idiopathic chronic gout, multiple sites, without tophus (tophi): Secondary | ICD-10-CM

## 2023-04-17 ENCOUNTER — Other Ambulatory Visit: Payer: Self-pay

## 2023-04-17 DIAGNOSIS — M1A09X0 Idiopathic chronic gout, multiple sites, without tophus (tophi): Secondary | ICD-10-CM

## 2023-04-17 MED ORDER — ALLOPURINOL 100 MG PO TABS
400.0000 mg | ORAL_TABLET | Freq: Every day | ORAL | 1 refills | Status: DC
Start: 2023-04-17 — End: 2023-10-23

## 2023-05-07 ENCOUNTER — Encounter: Payer: Self-pay | Admitting: Family Medicine

## 2023-05-07 ENCOUNTER — Encounter: Payer: Self-pay | Admitting: Internal Medicine

## 2023-05-07 MED ORDER — LOSARTAN POTASSIUM 50 MG PO TABS: 50.0000 mg | ORAL_TABLET | Freq: Every day | ORAL | 0 refills | Status: DC

## 2023-07-02 ENCOUNTER — Telehealth: Payer: Self-pay | Admitting: Family Medicine

## 2023-07-02 MED ORDER — LOSARTAN POTASSIUM 50 MG PO TABS: 50.0000 mg | ORAL_TABLET | Freq: Every day | ORAL | 0 refills | Status: DC

## 2023-07-02 NOTE — Addendum Note (Signed)
Addended by: Christy Sartorius on: 07/02/2023 03:21 PM   Modules accepted: Orders

## 2023-07-02 NOTE — Telephone Encounter (Signed)
Rx sent 

## 2023-07-02 NOTE — Telephone Encounter (Signed)
Prescription Request  07/02/2023  LOV: Visit date not found  What is the name of the medication or equipment? losartan (COZAAR) 50 MG tablet #90 supply w/refills  Have you contacted your pharmacy to request a refill? No   Which pharmacy would you like this sent to?   [facility] - [place], [place] - [address] [place] [address] Phone: [phone] Fax: [phone]    Patient notified that their request is being sent to the clinical staff for review and that they should receive a response within 2 business days.   Please advise at Mobile [phone] (mobile)

## 2023-07-22 ENCOUNTER — Ambulatory Visit (AMBULATORY_SURGERY_CENTER): Payer: Managed Care, Other (non HMO)

## 2023-07-22 ENCOUNTER — Encounter: Payer: Self-pay | Admitting: Internal Medicine

## 2023-07-22 VITALS — Ht 69.5 in

## 2023-07-22 DIAGNOSIS — Z860100 Personal history of colon polyps, unspecified: Secondary | ICD-10-CM

## 2023-07-22 MED ORDER — NA SULFATE-K SULFATE-MG SULF 17.5-3.13-1.6 GM/177ML PO SOLN: 1.0000 | Freq: Once | ORAL | 0 refills | Status: AC

## 2023-07-22 NOTE — Progress Notes (Signed)

## 2023-08-04 ENCOUNTER — Encounter: Payer: Self-pay | Admitting: Certified Registered Nurse Anesthetist

## 2023-08-05 ENCOUNTER — Ambulatory Visit: Payer: Managed Care, Other (non HMO) | Admitting: Internal Medicine

## 2023-08-05 ENCOUNTER — Encounter: Payer: Self-pay | Admitting: Internal Medicine

## 2023-08-05 VITALS — BP 129/85 | HR 52 | Temp 97.9°F | Resp 16 | Ht 69.5 in | Wt 200.0 lb

## 2023-08-05 DIAGNOSIS — D120 Benign neoplasm of cecum: Secondary | ICD-10-CM

## 2023-08-05 DIAGNOSIS — Z860100 Personal history of colon polyps, unspecified: Secondary | ICD-10-CM

## 2023-08-05 DIAGNOSIS — K648 Other hemorrhoids: Secondary | ICD-10-CM

## 2023-08-05 DIAGNOSIS — Z1211 Encounter for screening for malignant neoplasm of colon: Secondary | ICD-10-CM | POA: Diagnosis present

## 2023-08-05 MED ORDER — SODIUM CHLORIDE 0.9 % IV SOLN: 500.0000 mL | INTRAVENOUS | Status: DC

## 2023-08-05 NOTE — Progress Notes (Signed)
Report given to PACU, vss 

## 2023-08-05 NOTE — Progress Notes (Signed)
Pt's states no medical or surgical changes since previsit or office visit. 

## 2023-08-05 NOTE — Progress Notes (Signed)
Called to room to assist during endoscopic procedure.  Patient ID and intended procedure confirmed with present staff. Received instructions for my participation in the procedure from the performing physician.  

## 2023-08-05 NOTE — Patient Instructions (Signed)
Please read handouts provided. Continue present medications. Await pathology results. Repeat colonoscopy in 5 years for screening.   YOU HAD AN ENDOSCOPIC PROCEDURE TODAY AT THE [facility]:   Refer to the procedure report that was given to you for any specific questions about what was found during the examination.  If the procedure report does not answer your questions, please call your gastroenterologist to clarify.  If you requested that your care partner not be given the details of your procedure findings, then the procedure report has been included in a sealed envelope for you to review at your convenience later.  YOU SHOULD EXPECT: Some feelings of bloating in the abdomen. Passage of more gas than usual.  Walking can help get rid of the air that was put into your GI tract during the procedure and reduce the bloating. If you had a lower endoscopy (such as a colonoscopy or flexible sigmoidoscopy) you may notice spotting of blood in your stool or on the toilet paper. If you underwent a bowel prep for your procedure, you may not have a normal bowel movement for a few days.  Please Note:  You might notice some irritation and congestion in your nose or some drainage.  This is from the oxygen used during your procedure.  There is no need for concern and it should clear up in a day or so.  SYMPTOMS TO REPORT IMMEDIATELY:  Following lower endoscopy (colonoscopy or flexible sigmoidoscopy):  Excessive amounts of blood in the stool  Significant tenderness or worsening of abdominal pains  Swelling of the abdomen that is new, acute  Fever of 100F or higher  For urgent or emergent issues, a gastroenterologist can be reached at any hour by calling [phone]. Do not use MyChart messaging for urgent concerns.    DIET:  We do recommend a small meal at first, but then you may proceed to your regular diet.  Drink plenty of fluids but you should avoid alcoholic beverages for 24  hours.  ACTIVITY:  You should plan to take it easy for the rest of today and you should NOT DRIVE or use heavy machinery until tomorrow (because of the sedation medicines used during the test).    FOLLOW UP: Our staff will call the number listed on your records the next business day following your procedure.  We will call around 7:15- 8:00 am to check on you and address any questions or concerns that you may have regarding the information given to you following your procedure. If we do not reach you, we will leave a message.     If any biopsies were taken you will be contacted by phone or by letter within the next 1-3 weeks.  Please call us at [phone] if you have not heard about the biopsies in 3 weeks.    SIGNATURES/CONFIDENTIALITY: You and/or your care partner have signed paperwork which will be entered into your electronic medical record.  These signatures attest to the fact that that the information above on your After Visit Summary has been reviewed and is understood.  Full responsibility of the confidentiality of this discharge information lies with you and/or your care-partner. 

## 2023-08-05 NOTE — Progress Notes (Signed)
GASTROENTEROLOGY PROCEDURE H&P NOTE   Primary Care Physician: Kristian Covey, MD    Reason for Procedure:  History of multiple adenomas and SSP  Plan:    Colonoscopy  Patient is appropriate for endoscopic procedure(s) in the ambulatory ([facility]) setting.  The nature of the procedure, as well as the risks, benefits, and alternatives were carefully and thoroughly reviewed with the patient. Ample time for discussion and questions allowed. The patient understood, was satisfied, and agreed to proceed.     HPI: James Monroe is a 57 y.o. male who presents for surveillance colonoscopy.  Medical history as below.  Tolerated the prep.  No recent chest pain or shortness of breath.  No abdominal pain today.  Past Medical History:  Diagnosis Date   Chicken pox    Concussion 2001   Gout    Hyperlipidemia    Hypertension     Past Surgical History:  Procedure Laterality Date   COLONOSCOPY  06/16/2020   per Dr. Rhea Belton, adenmatous polyps, repeat in 3 yrs    Prior to Admission medications   Medication Sig Start Date End Date Taking? Authorizing Provider  allopurinol (ZYLOPRIM) 100 MG tablet Take 4 tablets (400 mg total) by mouth daily. 04/17/23  Yes Burchette, Elberta Fortis, MD  losartan (COZAAR) 50 MG tablet Take 1 tablet (50 mg total) by mouth daily. 07/02/23  Yes Burchette, Elberta Fortis, MD  colchicine 0.6 MG tablet Take two at onset and then one po bid prn acute gout flares. Patient not taking: Reported on 08/05/2023 10/12/20   Kristian Covey, MD    Current Outpatient Medications  Medication Sig Dispense Refill   allopurinol (ZYLOPRIM) 100 MG tablet Take 4 tablets (400 mg total) by mouth daily. 360 tablet 1   losartan (COZAAR) 50 MG tablet Take 1 tablet (50 mg total) by mouth daily. 90 tablet 0   colchicine 0.6 MG tablet Take two at onset and then one po bid prn acute gout flares. (Patient not taking: Reported on 08/05/2023) 60 tablet 1   Current Facility-Administered Medications   Medication Dose Route Frequency Provider Last Rate Last Admin   0.9 %  sodium chloride infusion  500 mL Intravenous Continuous Shanelle Clontz, Carie Caddy, MD        Allergies as of 08/05/2023   (No Known Allergies)    Family History  Problem Relation Age of Onset   Stroke Mother    Alzheimer's disease Mother    Heart disease Father 35       bypass surgery 1995   Hypertension Father    Colon cancer Maternal Uncle    Colon cancer Maternal Grandmother    Esophageal cancer Neg Hx    Rectal cancer Neg Hx    Stomach cancer Neg Hx     Social History   Socioeconomic History   Marital status: Single    Spouse name: Not on file   Number of children: Not on file   Years of education: Not on file   Highest education level: Not on file  Occupational History   Not on file  Tobacco Use   Smoking status: Never   Smokeless tobacco: Never  Vaping Use   Vaping status: Never Used  Substance and Sexual Activity   Alcohol use: Yes    Comment: moderate social drinking   Drug use: No   Sexual activity: Not on file  Other Topics Concern   Not on file  Social History Narrative   Not on file   Social Determinants  of Health   Financial Resource Strain: Not on file  Food Insecurity: Not on file  Transportation Needs: Not on file  Physical Activity: Not on file  Stress: Not on file  Social Connections: Not on file  Intimate Partner Violence: Not on file    Physical Exam: Vital signs in last 24 hours: @BP  (!) 159/89   Pulse 67   Temp 97.9 F (36.6 C) (Temporal)   Ht 5' 9.5" (1.765 m)   Wt 200 lb (90.7 kg)   SpO2 98%   BMI 29.11 kg/m  GEN: NAD EYE: Sclerae anicteric ENT: MMM CV: Non-tachycardic Pulm: CTA b/l GI: Soft, NT/ND NEURO:  Alert & Oriented x 3   Erick Blinks, MD [facility]  08/05/2023 8:59 AM

## 2023-08-05 NOTE — Op Note (Signed)
[facility] Patient Name: James Monroe Procedure Date: 08/05/2023 9:00 AM MRN: [id number] Endoscopist: Beverley Fiedler , MD, [id number] Age: 57 Referring MD:  Date of Birth: [birth year] Gender: Male Account #: [id number] Procedure:                Colonoscopy Indications:              High risk colon cancer surveillance: Personal                            history of multiple adenomas, Last colonoscopy:                            October 2021 (TA x 4), May 2018 (TA and SSP                            including one > 1 cm) Medicines:                Monitored Anesthesia Care Procedure:                Pre-Anesthesia Assessment:                           - Prior to the procedure, a History and Physical                            was performed, and patient medications and                            allergies were reviewed. The patient's tolerance of                            previous anesthesia was also reviewed. The risks                            and benefits of the procedure and the sedation                            options and risks were discussed with the patient.                            All questions were answered, and informed consent                            was obtained. Prior Anticoagulants: The patient has                            taken no anticoagulant or antiplatelet agents. ASA                            Grade Assessment: II - A patient with mild systemic                            disease. After reviewing the risks and benefits,  the patient was deemed in satisfactory condition to                            undergo the procedure.                           After obtaining informed consent, the colonoscope                            was passed under direct vision. Throughout the                            procedure, the patient's blood pressure, pulse, and                            oxygen saturations were monitored continuously. The                             CF [id number] #[id number] was introduced through the anus                            and advanced to the cecum, identified by                            appendiceal orifice and ileocecal valve. The                            colonoscopy was performed without difficulty. The                            patient tolerated the procedure well. The quality                            of the bowel preparation was good. The ileocecal                            valve, appendiceal orifice, and rectum were                            photographed. Scope In: 9:12:45 AM Scope Out: 9:23:24 AM Scope Withdrawal Time: 0 hours 9 minutes 24 seconds  Total Procedure Duration: 0 hours 10 minutes 39 seconds  Findings:                 The digital rectal exam was normal.                           A 2 mm polyp was found in the cecum. The polyp was                            sessile. The polyp was removed with a cold biopsy                            forceps. Resection and retrieval were complete.  Internal hemorrhoids were found during                            retroflexion. The hemorrhoids were small.                           The exam was otherwise without abnormality. Complications:            No immediate complications. Estimated Blood Loss:     Estimated blood loss: none. Impression:               - One 2 mm polyp in the cecum, removed with a cold                            biopsy forceps. Resected and retrieved.                           - Small internal hemorrhoids.                           - The examination was otherwise normal. Recommendation:           - Patient has a contact number available for                            emergencies. The signs and symptoms of potential                            delayed complications were discussed with the                            patient. Return to normal activities tomorrow.                            Written discharge  instructions were provided to the                            patient.                           - Resume previous diet.                           - Continue present medications.                           - Await pathology results.                           - Repeat colonoscopy in 5 years for surveillance. Beverley Fiedler, MD 08/05/2023 9:25:55 AM This report has been signed electronically.

## 2023-08-06 ENCOUNTER — Telehealth: Payer: Self-pay

## 2023-08-06 NOTE — Telephone Encounter (Signed)
No answer, left message to call if having any issues or concerns, B.Lj Miyamoto RN 

## 2023-08-08 ENCOUNTER — Encounter: Payer: Self-pay | Admitting: Internal Medicine

## 2023-08-08 LAB — SURGICAL PATHOLOGY

## 2023-10-23 ENCOUNTER — Other Ambulatory Visit: Payer: Self-pay | Admitting: Family Medicine

## 2023-10-23 DIAGNOSIS — M1A09X0 Idiopathic chronic gout, multiple sites, without tophus (tophi): Secondary | ICD-10-CM

## 2023-12-25 ENCOUNTER — Encounter: Payer: Managed Care, Other (non HMO) | Admitting: Family Medicine

## 2023-12-30 ENCOUNTER — Encounter: Admitting: Family Medicine

## 2024-01-21 ENCOUNTER — Telehealth: Payer: Self-pay | Admitting: Family Medicine

## 2024-01-21 ENCOUNTER — Ambulatory Visit: Payer: Self-pay | Admitting: Family Medicine

## 2024-01-21 ENCOUNTER — Ambulatory Visit (INDEPENDENT_AMBULATORY_CARE_PROVIDER_SITE_OTHER): Admitting: Family Medicine

## 2024-01-21 ENCOUNTER — Encounter: Payer: Self-pay | Admitting: Family Medicine

## 2024-01-21 VITALS — BP 166/100 | HR 70 | Temp 98.3°F | Ht 68.9 in | Wt 225.2 lb

## 2024-01-21 DIAGNOSIS — Z125 Encounter for screening for malignant neoplasm of prostate: Secondary | ICD-10-CM | POA: Diagnosis not present

## 2024-01-21 DIAGNOSIS — Z0000 Encounter for general adult medical examination without abnormal findings: Secondary | ICD-10-CM

## 2024-01-21 DIAGNOSIS — Z23 Encounter for immunization: Secondary | ICD-10-CM | POA: Diagnosis not present

## 2024-01-21 DIAGNOSIS — E785 Hyperlipidemia, unspecified: Secondary | ICD-10-CM | POA: Diagnosis not present

## 2024-01-21 LAB — CBC WITH DIFFERENTIAL/PLATELET
Basophils Absolute: 0.1 10*3/uL (ref 0.0–0.1)
Basophils Relative: 1 % (ref 0.0–3.0)
Eosinophils Absolute: 0.3 10*3/uL (ref 0.0–0.7)
Eosinophils Relative: 4.6 % (ref 0.0–5.0)
HCT: 42.4 % (ref 39.0–52.0)
Hemoglobin: 14.5 g/dL (ref 13.0–17.0)
Lymphocytes Relative: 38.3 % (ref 12.0–46.0)
Lymphs Abs: 2.1 10*3/uL (ref 0.7–4.0)
MCHC: 34.3 g/dL (ref 30.0–36.0)
MCV: 86.7 fl (ref 78.0–100.0)
Monocytes Absolute: 0.4 10*3/uL (ref 0.1–1.0)
Monocytes Relative: 6.9 % (ref 3.0–12.0)
Neutro Abs: 2.8 10*3/uL (ref 1.4–7.7)
Neutrophils Relative %: 49.2 % (ref 43.0–77.0)
Platelets: 214 10*3/uL (ref 150.0–400.0)
RBC: 4.89 Mil/uL (ref 4.22–5.81)
RDW: 13.3 % (ref 11.5–15.5)
WBC: 5.6 10*3/uL (ref 4.0–10.5)

## 2024-01-21 LAB — HEPATIC FUNCTION PANEL
ALT: 34 U/L (ref 0–53)
AST: 21 U/L (ref 0–37)
Albumin: 4.6 g/dL (ref 3.5–5.2)
Alkaline Phosphatase: 75 U/L (ref 39–117)
Bilirubin, Direct: 0.1 mg/dL (ref 0.0–0.3)
Total Bilirubin: 0.4 mg/dL (ref 0.2–1.2)
Total Protein: 6.8 g/dL (ref 6.0–8.3)

## 2024-01-21 LAB — BASIC METABOLIC PANEL WITH GFR
BUN: 21 mg/dL (ref 6–23)
CO2: 25 meq/L (ref 19–32)
Calcium: 9 mg/dL (ref 8.4–10.5)
Chloride: 105 meq/L (ref 96–112)
Creatinine, Ser: 0.92 mg/dL (ref 0.40–1.50)
GFR: 90.91 mL/min (ref >60.00)
Glucose, Bld: 107 mg/dL — ABNORMAL HIGH (ref 70–99)
Potassium: 4.3 meq/L (ref 3.5–5.1)
Sodium: 140 meq/L (ref 135–145)

## 2024-01-21 LAB — LIPID PANEL
Cholesterol: 246 mg/dL — ABNORMAL HIGH (ref 0–200)
HDL: 46.6 mg/dL (ref >39.00)
LDL Cholesterol: 173 mg/dL — ABNORMAL HIGH (ref 0–99)
NonHDL: 199.39
Total CHOL/HDL Ratio: 5
Triglycerides: 132 mg/dL (ref 0.0–149.0)
VLDL: 26.4 mg/dL (ref 0.0–40.0)

## 2024-01-21 LAB — PSA: PSA: 0.68 ng/mL (ref 0.10–4.00)

## 2024-01-21 MED ORDER — LOSARTAN POTASSIUM 100 MG PO TABS: 100.0000 mg | ORAL_TABLET | Freq: Every day | ORAL | 3 refills | Status: DC

## 2024-01-21 NOTE — Patient Instructions (Signed)
 Bring in your BP cuff with readings at follow up to review.

## 2024-01-21 NOTE — Progress Notes (Signed)
 Established Patient Office Visit  Subjective   Patient ID: James Monroe, male    DOB: 1965/10/03  Age: 58 y.o. MRN: [id number]  Chief Complaint  Patient presents with   Annual Exam    HPI   James Monroe is here for annual physical exam.  He has history of hypertension treated with losartan  50 mg daily.  Compliant with therapy.  Blood pressure is up quite a bit today which is atypical for him.  He has gout which is controlled with allopurinol .  Denies any recent headaches or dizziness.  Exercises fairly regularly.  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  States dad had bypass at age 53.  James Monroe had coronary calcium score of 0 at age 75.  Does have history of hyperlipidemia.  Health maintenance reviewed:  Health Maintenance  Topic Date Due   COVID-19 Vaccine (3 - 2024-25 season) 04/28/2023   INFLUENZA VACCINE  03/27/2024   Colonoscopy  08/04/2028   DTaP/Tdap/Td (3 - Td or Tdap) 10/19/2031   Hepatitis C Screening  Completed   HIV Screening  Completed   Zoster Vaccines- Shingrix   Completed   Pneumococcal Vaccine 59-26 Years old  Aged Out   HPV VACCINES  Aged Out   Meningococcal B Vaccine  Aged Out   Family history-mother had Alzheimer's dementia.  This was late onset.  Father had bypass age 83.  He is in his mid 64s.  His father has hypertension history.  He has 2 sisters alive and well.   Social history-never smoked.  Infrequent alcohol with usually only about 1 beverage every other month or so.  Has scaled back this year.  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   Past Medical History:  Diagnosis Date   Chicken pox    Concussion 2001   Gout    Hyperlipidemia    Hypertension    Past Surgical History:  Procedure Laterality Date   COLONOSCOPY  06/16/2020   per Dr. Bridgett Camps, adenmatous polyps, repeat in 3 yrs    reports that he has never smoked. He has never used smokeless tobacco. He reports current alcohol use. He reports that he does not use drugs. family history includes  Alzheimer's disease in his mother; Colon cancer in his maternal grandmother and maternal uncle; Heart disease (age of onset: 62) in his father; Hypertension in his father; Stroke in his mother. No Known Allergies   The 10-year ASCVD risk score (Arnett DK, et al., 2019) is: 17.8%   Values used to calculate the score:     Age: 32 years     Sex: Male     Is Non-Hispanic African American: No     Diabetic: No     Tobacco smoker: No     Systolic Blood Pressure: 166 mmHg     Is BP treated: Yes     HDL Cholesterol: 47.7 mg/dL     Total Cholesterol: 245 mg/dL   Review of Systems  Constitutional:  Negative for malaise/fatigue.  Eyes:  Negative for blurred vision.  Respiratory:  Negative for shortness of breath.   Cardiovascular:  Negative for chest pain.  Neurological:  Negative for dizziness, weakness and headaches.      Objective:     BP (!) 166/100 (BP Location: Left Arm, Patient Position: Sitting, Cuff Size: Large)   Pulse 70   Temp 98.3 F (36.8 C) (Oral)   Ht 5' 8.9" (1.75 m)   Wt 225 lb 3.2 oz (102.2 kg)   SpO2 95%  BMI 33.35 kg/m  BP Readings from Last 3 Encounters:  01/21/24 (!) 166/100  08/05/23 129/85  12/24/22 126/80   Wt Readings from Last 3 Encounters:  01/21/24 225 lb 3.2 oz (102.2 kg)  08/05/23 200 lb (90.7 kg)  12/24/22 223 lb (101.2 kg)      Physical Exam Vitals reviewed.  Constitutional:      General: He is not in acute distress.    Appearance: He is well-developed. He is not ill-appearing, toxic-appearing or diaphoretic.  HENT:     Head: Normocephalic and atraumatic.     Right Ear: External ear normal.     Left Ear: External ear normal.  Eyes:     Conjunctiva/sclera: Conjunctivae normal.     Pupils: Pupils are equal, round, and reactive to light.  Neck:     Thyroid : No thyromegaly.  Cardiovascular:     Rate and Rhythm: Normal rate and regular rhythm.     Heart sounds: Normal heart sounds. No murmur heard. Pulmonary:     Effort: No  respiratory distress.     Breath sounds: No wheezing or rales.  Abdominal:     General: Bowel sounds are normal. There is no distension.     Palpations: Abdomen is soft. There is no mass.     Tenderness: There is no abdominal tenderness. There is no guarding or rebound.  Musculoskeletal:     Cervical back: Normal range of motion and neck supple.     Right lower leg: No edema.     Left lower leg: No edema.  Lymphadenopathy:     Cervical: No cervical adenopathy.  Skin:    Findings: No rash.  Neurological:     Mental Status: He is alert and oriented to person, place, and time.     Cranial Nerves: No cranial nerve deficit.      No results found for any visits on 01/21/24.    The 10-year ASCVD risk score (Arnett DK, et al., 2019) is: 17.8%    Assessment & Plan:   Problem List Items Addressed This Visit   None Visit Diagnoses       Physical exam    -  Primary   Relevant Orders   Basic metabolic panel with GFR   Lipid panel   CBC with Differential/Platelet   Hepatic function panel   PSA     Hyperlipidemia, unspecified hyperlipidemia type       Relevant Medications   losartan  (COZAAR ) 100 MG tablet   Other Relevant Orders   Lipoprotein A (LPA)     Need for pneumococcal vaccination       Relevant Orders   Pneumococcal conjugate vaccine 20-valent (Prevnar 20) (Completed)     Blood pressure was quite elevated today which is atypical for him.  We recommended bumping up his losartan  to 100 mg daily and start checking home blood pressures regularly and bring in his cuff and readings for follow-up in 3 weeks.  If still up at that point consider additional medication  -Reviewed health maintenance.  Discussed newer guidelines recommending pneumonia vaccination after age 76.  Patient consents to Prevnar 20 and this was given  -Other health maintenance up-to-date  -He does have history of hyperlipidemia and family history of premature CAD in his father.  He had coronary calcium  score 7 years ago of 0.  We discussed possible LP(a) screening for further risk stratification.  We also discussed possible repeat coronary calcium score in a couple years or so and possibly sooner if  LP(a) is elevated  -Continue regular exercise habits  Return in about 3 weeks (around 02/11/2024).    Glean Lamy, MD

## 2024-01-21 NOTE — Telephone Encounter (Signed)
 Please see result note

## 2024-01-21 NOTE — Telephone Encounter (Signed)
 Copied from CRM [id number]. Topic: General - Call Back - No Documentation >> Jan 21, 2024  2:02 PM Jenice Mitts wrote: Reason for CRM: Patient is calling in because he stated he received a call

## 2024-01-25 LAB — LIPOPROTEIN A (LPA): Lipoprotein (a): 15 nmol/L (ref <75)

## 2024-02-11 ENCOUNTER — Ambulatory Visit: Admitting: Family Medicine

## 2024-02-12 ENCOUNTER — Encounter: Payer: Self-pay | Admitting: Family Medicine

## 2024-02-14 ENCOUNTER — Ambulatory Visit: Admitting: Family Medicine

## 2024-02-14 ENCOUNTER — Encounter: Payer: Self-pay | Admitting: Family Medicine

## 2024-02-14 VITALS — BP 146/80 | HR 59 | Temp 97.9°F | Wt 223.3 lb

## 2024-02-14 DIAGNOSIS — I10 Essential (primary) hypertension: Secondary | ICD-10-CM | POA: Diagnosis not present

## 2024-02-14 NOTE — Patient Instructions (Signed)
Continue to monitor BP and be in touch if consistently > 140/90

## 2024-02-14 NOTE — Progress Notes (Signed)
 Established Patient Office Visit  Subjective   Patient ID: James Monroe, male    DOB: 11/28/1965  Age: 58 y.o. MRN: [id number]  Chief Complaint  Patient presents with   Medical Management of Chronic Issues    HPI   James Monroe is seen for follow-up hypertension.  Very high reading with visit here recently.  James Monroe has home automated cuff and send in some readings recently that were very well-controlled.  Most of these were 120s systolic and 70s diastolic.  We did bump his losartan  from 50 to 100 mg.  No recent dizziness or chest pains.  No peripheral edema.  We obtain recent LP(a) of 15.  James Monroe has also had previous coronary calcium score of 0-7 years ago.  Does have hyperlipidemia with total cholesterol 246 and LDL of 173.  Past Medical History:  Diagnosis Date   Chicken pox    Concussion 2001   Gout    Hyperlipidemia    Hypertension    Past Surgical History:  Procedure Laterality Date   COLONOSCOPY  06/16/2020   per Dr. Bridgett Camps, adenmatous polyps, repeat in 3 yrs    reports that James Monroe has never smoked. James Monroe has never used smokeless tobacco. James Monroe reports current alcohol use. James Monroe reports that James Monroe does not use drugs. family history includes Alzheimer's disease in his mother; Colon cancer in his maternal grandmother and maternal uncle; Heart disease (age of onset: 12) in his father; Hypertension in his father; Stroke in his mother. No Known Allergies  Review of Systems  Constitutional:  Negative for malaise/fatigue.  Eyes:  Negative for blurred vision.  Respiratory:  Negative for shortness of breath.   Cardiovascular:  Negative for chest pain.  Neurological:  Negative for dizziness, weakness and headaches.      Objective:     BP (!) 146/80   Pulse (!) 59   Temp 97.9 F (36.6 C) (Oral)   Wt 223 lb 4.8 oz (101.3 kg)   SpO2 96%   BMI 33.07 kg/m  BP Readings from Last 3 Encounters:  02/14/24 (!) 146/80  01/21/24 (!) 166/100  08/05/23 129/85   Wt Readings from Last 3 Encounters:   02/14/24 223 lb 4.8 oz (101.3 kg)  01/21/24 225 lb 3.2 oz (102.2 kg)  08/05/23 200 lb (90.7 kg)      Physical Exam Vitals reviewed.  Constitutional:      General: James Monroe is not in acute distress.    Appearance: James Monroe is not ill-appearing.   Cardiovascular:     Rate and Rhythm: Normal rate and regular rhythm.   Neurological:     Mental Status: James Monroe is alert.      No results found for any visits on 02/14/24.  Last CBC Lab Results  Component Value Date   WBC 5.6 01/21/2024   HGB 14.5 01/21/2024   HCT 42.4 01/21/2024   MCV 86.7 01/21/2024   RDW 13.3 01/21/2024   PLT 214.0 01/21/2024   Last metabolic panel Lab Results  Component Value Date   GLUCOSE 107 (H) 01/21/2024   NA 140 01/21/2024   K 4.3 01/21/2024   CL 105 01/21/2024   CO2 25 01/21/2024   BUN 21 01/21/2024   CREATININE 0.92 01/21/2024   GFR 90.91 01/21/2024   CALCIUM 9.0 01/21/2024   PROT 6.8 01/21/2024   ALBUMIN 4.6 01/21/2024   BILITOT 0.4 01/21/2024   ALKPHOS 75 01/21/2024   AST 21 01/21/2024   ALT 34 01/21/2024   Last lipids Lab Results  Component Value Date  CHOL 246 (H) 01/21/2024   HDL 46.60 01/21/2024   LDLCALC 173 (H) 01/21/2024   LDLDIRECT 171.0 05/22/2019   TRIG 132.0 01/21/2024   CHOLHDL 5 01/21/2024   Last hemoglobin A1c Lab Results  Component Value Date   HGBA1C 5.8 12/24/2022      The 10-year ASCVD risk score (Arnett DK, et al., 2019) is: 14.7%    Assessment & Plan:   #1 hypertension.  Possible element of whitecoat syndrome.  Repeat blood pressure today left arm seated after rest with his cuff 163/92 and by reading 152/90.  His home readings have consistently been well-controlled.  Continue low-sodium diet, regular exercise, weight control.  Be in touch if consistent readings over 140 systolic or 90 diastolic.  Did also briefly discussed possible 24-hour ambulatory blood pressure monitoring but will continue home monitoring in the meantime  #2 hyperlipidemia.  Recent LDL 173.   Favorable LP(a) of 15 and previous coronary calcium score 0  Glean Lamy, MD

## 2024-04-20 ENCOUNTER — Other Ambulatory Visit: Payer: Self-pay | Admitting: Family Medicine

## 2024-04-20 DIAGNOSIS — M1A09X0 Idiopathic chronic gout, multiple sites, without tophus (tophi): Secondary | ICD-10-CM

## 2024-06-03 ENCOUNTER — Other Ambulatory Visit: Payer: Self-pay

## 2024-06-03 MED ORDER — LOSARTAN POTASSIUM 100 MG PO TABS: 100.0000 mg | ORAL_TABLET | Freq: Every day | ORAL | 1 refills | Status: AC
